# Patient Record
Sex: Female | Born: 1990 | Hispanic: Yes | Marital: Married | Smoking: Never smoker
Health system: Southern US, Community
[De-identification: ages and names within clinical notes are randomized; demographics above are authoritative.]

## PROBLEM LIST (undated history)

## (undated) ENCOUNTER — Inpatient Hospital Stay (HOSPITAL_COMMUNITY): Payer: Self-pay

## (undated) DIAGNOSIS — Z789 Other specified health status: Secondary | ICD-10-CM

## (undated) HISTORY — PX: WISDOM TOOTH EXTRACTION: SHX21

---
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
## 2010-02-19 ENCOUNTER — Emergency Department (HOSPITAL_COMMUNITY)
Admission: EM | Admit: 2010-02-19 | Discharge: 2010-02-19 | Payer: Self-pay | Source: Home / Self Care | Admitting: Emergency Medicine

## 2010-03-10 NOTE — Miscellaneous (Signed)
Summary: ROI  ROI   Imported By: Clydell Hakim 07/07/2009 10:15:43  _____________________________________________________________________  External Attachment:    Type:   Image     Comment:   External Document

## 2010-03-10 NOTE — Assessment & Plan Note (Signed)
Summary: NP visit, bacterial vaginosis   Vital Signs:  Patient profile:   20 year old female Height:      59.75 inches Weight:      125.4 pounds BMI:     24.79 Temp:     98.4 degrees F oral Pulse rate:   86 / minute Pulse rhythm:   regular BP sitting:   108 / 71  (left arm) Cuff size:   regular  Vitals Entered By: Loralee Pacas CMA (Jul 03, 2009 2:23 PM) CC: new patient Is Patient Diabetic? No   Primary Care Provider:  Jamie Brookes MD  CC:  new patient.  History of Present Illness: Pt comes in today to establish care. She is a healthy 20 y/o who comes in with her son. She has been having some vaginal itching and discharge and wants to be tested for STD's today. No other complaints.   Habits & Providers  Alcohol-Tobacco-Diet     Tobacco Status: never  Current Medications (verified): 1)  None  Allergies (verified): No Known Drug Allergies  Past History:  Past Medical History: keeps up on dental care. LAst seen Jan or Feb of 2011 Last pap done at Dr. Robinette Haines office. Feb 2011  Past Surgical History: none  Family History: Dad: unknown Mom: healthy 1 brother: healthy 2 sister: healthy  Social History: lives with mom, (1970), husband Trudi Ida [phone]) and son Terrell Shimko (2009), never smoked, no drugs, no EtOH, exercising 2 days a week or less, high school grad, renting, Smoking Status:  never  Review of Systems        vitals reviewed and pertinent negatives and positives seen in HPI   Physical Exam  General:  Well-developed,well-nourished,in no acute distress; alert,appropriate and cooperative throughout examination Head:  Normocephalic and atraumatic without obvious abnormalities. No apparent alopecia or balding. Eyes:  No corneal or conjunctival inflammation noted. EOMI. Perrla. Vision grossly normal. Ears:  External ear exam shows no significant lesions or deformities.  Otoscopic examination reveals clear canals, tympanic membranes are  intact bilaterally without bulging, retraction, inflammation or discharge. Hearing is grossly normal bilaterally. Nose:  External nasal examination shows no deformity or inflammation. Nasal mucosa are pink and moist without lesions or exudates. Mouth:  Oral mucosa and oropharynx without lesions or exudates.  Teeth in good repair. Neck:  No deformities, masses, or tenderness noted. Lungs:  Normal respiratory effort, chest expands symmetrically. Lungs are clear to auscultation, no crackles or wheezes. Heart:  Normal rate and regular rhythm. S1 and S2 normal without gallop, murmur, click, rub or other extra sounds. Abdomen:  Bowel sounds positive,abdomen soft and non-tender without masses, organomegaly or hernias noted. Genitalia:  Normal introitus for age, no external lesions, no vaginal discharge, mucosa pink and moist, no vaginal or cervical lesions, no vaginal atrophy, no friaility or hemorrhage, normal uterus size and position, no adnexal masses or tenderness Extremities:  No clubbing, cyanosis, edema, or deformity noted with normal full range of motion of all joints.   Skin:  Intact without suspicious lesions or rashes Psych:  Cognition and judgment appear intact. Alert and cooperative with normal attention span and concentration. No apparent delusions, illusions, hallucinations   Impression & Recommendations:  Problem # 1:  PREVENTIVE HEALTH CARE (ICD-V70.0) Assessment New This is a new patient visit. The patient would like to be tested for STD's. Her exam is benign. She notes that she started having some vaginal itching when she got her Implanon which has not yet been 1 year. She  wanted to know if she could take the implanon out when she needed it taken and we are happy to provide that service.   Problem # 2:  SEXUALLY TRANSMITTED DISEASE, EXPOSURE TO (ICD-V01.6) Assessment: Comment Only We will test for HIV and RPR today  Orders: RPR-[facility] ([id number]) HIV-[facility] ([id number])  Problem #  3:  VAGINAL DISCHARGE (ICD-623.5) Assessment: New THis is more a vaginal itching that started after she had the Implanon put in. Likely unrelated but will check labs today.   Orders: GC/Chlamydia[facility] ([id number]) [id number]  Complete Medication List: 1)  Metronidazole 500 Mg Tabs (Metronidazole) .... Take 1 pill by mouth every 12 hours for 7 days. do not breast feed or drink alcohol while taking this medication.  Patient Instructions: 1)  It was good to meet you today.  2)  I will you with any abnormal results.  3)  I hope you have a wonderful day.  Prescriptions: METRONIDAZOLE 500 MG TABS (METRONIDAZOLE) take 1 pill by mouth every 12 hours for 7 days. Do not breast feed or drink alcohol while taking this medication.  #14 x 0   Entered and Authorized by:   Jamie Brookes MD   Signed by:   Jamie Brookes MD on 07/09/2009   Method used:   Electronically to        [id number] (retail)       [address] [place], [place]  [address]       Ph: [phone]       Fax: [phone]   RxID:   [id number]   Laboratory Results  Date/Time Received: Jul 03, 2009 2:54 PM  Date/Time Reported: Jul 03, 2009 3:06 PM   [employer] Source: vag WBC/hpf: 10-20 Bacteria/hpf: 3+  Cocci Clue cells/hpf: many  Positive whiff Yeast/hpf: few Trichomonas/hpf: none Comments: ...............test performed by......Marland KitchenBonnie A. [place], MLS (ASCP)cm     Appended Document: NP visit, bacterial vaginosis pt notified

## 2010-03-11 ENCOUNTER — Encounter: Payer: Self-pay | Admitting: *Deleted

## 2010-05-04 LAB — RAPID STREP SCREEN (MED CTR MEBANE ONLY): Streptococcus, Group A Screen (Direct): NEGATIVE

## 2010-05-04 LAB — MONONUCLEOSIS SCREEN: Mono Screen: NEGATIVE

## 2010-11-10 LAB — CBC
Hemoglobin: 13.3 g/dL (ref 12.0–16.0)
MCHC: 34 g/dL (ref 31.0–37.0)
MCHC: 34.4 g/dL (ref 31.0–37.0)
MCV: 88.6 fL (ref 78.0–98.0)
Platelets: 233 10*3/uL (ref 150–400)
Platelets: 257 10*3/uL (ref 150–400)
RDW: 14.3 % (ref 11.4–15.5)
WBC: 14.3 10*3/uL — ABNORMAL HIGH (ref 4.5–13.5)

## 2010-11-19 ENCOUNTER — Encounter: Payer: Self-pay | Admitting: Family Medicine

## 2010-11-19 ENCOUNTER — Ambulatory Visit (INDEPENDENT_AMBULATORY_CARE_PROVIDER_SITE_OTHER): Payer: Self-pay | Admitting: Family Medicine

## 2010-11-19 VITALS — BP 107/74 | HR 85 | Temp 97.1°F | Ht 61.0 in | Wt 139.0 lb

## 2010-11-19 DIAGNOSIS — Z3046 Encounter for surveillance of implantable subdermal contraceptive: Secondary | ICD-10-CM

## 2010-11-19 NOTE — Patient Instructions (Signed)
What is this medicine? ETONOGESTREL is a contraceptive (birth control) device. It is used to prevent pregnancy. It can be used for up to 3 years. This medicine may be used for other purposes; ask your health care provider or pharmacist if you have questions.   What should I tell my health care provider before I take this medicine? They need to know if you have any of these conditions: -abnormal vaginal bleeding -blood vessel disease or blood clots -cancer of the breast, cervix, or liver -depression -diabetes -gallbladder disease -headaches -heart disease or recent heart attack -high blood pressure -high cholesterol -kidney disease -liver disease -renal disease -seizures -tobacco smoker -an unusual or allergic reaction to etonogestrel, other hormones, anesthetics or antiseptics, medicines, foods, dyes, or preservatives -pregnant or trying to get pregnant -breast-feeding   How should I use this medicine? This device is inserted just under the skin on the inner side of your upper arm by a health care professional. Talk to your pediatrician regarding the use of this medicine in children. Special care may be needed. Overdosage: If you think you've taken too much of this medicine contact a poison control center or emergency room at once. NOTE: This medicine is only for you. Do not share this medicine with others.   What if I miss a dose? This does not apply.   What may interact with this medicine? Do not take this medicine with any of the following medications: -amprenavir -bosentan -fosamprenavir This medicine may also interact with the following medications: -barbiturate medicines for inducing sleep or treating seizures -certain medicines for fungal infections like ketoconazole and itraconazole -griseofulvin -medicines to treat seizures like carbamazepine, felbamate, oxcarbazepine, phenytoin, topiramate -modafinil -phenylbutazone -rifampin -some medicines to treat HIV  infection like atazanavir, indinavir, lopinavir, nelfinavir, tipranavir, ritonavir -St. John's wort This list may not describe all possible interactions. Give your health care provider a list of all the medicines, herbs, non-prescription drugs, or dietary supplements you use. Also tell them if you smoke, drink alcohol, or use illegal drugs. Some items may interact with your medicine.   What should I watch for while using this medicine? This product does not protect you against HIV infection (AIDS) or other sexually transmitted diseases. You should be able to feel the implant by pressing your fingertips over the skin where it was inserted. Tell your doctor if you cannot feel the implant.   What side effects may I notice from receiving this medicine? Side effects that you should report to your doctor or health care professional as soon as possible: -allergic reactions like skin rash, itching or hives, swelling of the face, lips, or tongue -breast lumps -changes in vision -confusion, trouble speaking or understanding -dark urine -depressed mood -general ill feeling or flu-like symptoms -light-colored stools -loss of appetite, nausea -right upper belly pain -severe headaches -severe pain, swelling, or tenderness in the abdomen -shortness of breath, chest pain, swelling in a leg -signs of pregnancy -sudden numbness or weakness of the face, arm or leg -trouble walking, dizziness, loss of balance or coordination -unusual vaginal bleeding, discharge -unusually weak or tired -yellowing of the eyes or skin Side effects that usually do not require medical attention (Report these to your doctor or health care professional if they continue or are bothersome.): -acne -breast pain -changes in weight -cough -fever or chills -headache -irregular menstrual bleeding -itching, burning, and vaginal discharge -pain or difficulty passing urine -sore throat This list may not describe all possible  side effects. Call your doctor for  medical advice about side effects. You may report side effects to FDA at 1-800-FDA-1088.   Where should I keep my medicine? This drug is given in a hospital or clinic and will not be stored at home.   NOTE: This sheet is a summary. It may not cover all possible information. If you have questions about this medicine, talk to your doctor, pharmacist, or health care provider.      2011, Elsevier/Gold Standard.    What is this medicine? ETONOGESTREL is a contraceptive (birth control) device. It is used to prevent pregnancy. It can be used for up to 3 years. This medicine may be used for other purposes; ask your health care provider or pharmacist if you have questions.   What should I tell my health care provider before I take this medicine? They need to know if you have any of these conditions: -abnormal vaginal bleeding -blood vessel disease or blood clots -cancer of the breast, cervix, or liver -depression -diabetes -gallbladder disease -headaches -heart disease or recent heart attack -high blood pressure -high cholesterol -kidney disease -liver disease -renal disease -seizures -tobacco smoker -an unusual or allergic reaction to etonogestrel, other hormones, anesthetics or antiseptics, medicines, foods, dyes, or preservatives -pregnant or trying to get pregnant -breast-feeding   How should I use this medicine? This device is inserted just under the skin on the inner side of your upper arm by a health care professional. Talk to your pediatrician regarding the use of this medicine in children. Special care may be needed. Overdosage: If you think you've taken too much of this medicine contact a poison control center or emergency room at once. NOTE: This medicine is only for you. Do not share this medicine with others.   What if I miss a dose? This does not apply.   What may interact with this medicine? Do not take this medicine with any of  the following medications: -amprenavir -bosentan -fosamprenavir This medicine may also interact with the following medications: -barbiturate medicines for inducing sleep or treating seizures -certain medicines for fungal infections like ketoconazole and itraconazole -griseofulvin -medicines to treat seizures like carbamazepine, felbamate, oxcarbazepine, phenytoin, topiramate -modafinil -phenylbutazone -rifampin -some medicines to treat HIV infection like atazanavir, indinavir, lopinavir, nelfinavir, tipranavir, ritonavir -St. John's wort This list may not describe all possible interactions. Give your health care provider a list of all the medicines, herbs, non-prescription drugs, or dietary supplements you use. Also tell them if you smoke, drink alcohol, or use illegal drugs. Some items may interact with your medicine.   What should I watch for while using this medicine? This product does not protect you against HIV infection (AIDS) or other sexually transmitted diseases. You should be able to feel the implant by pressing your fingertips over the skin where it was inserted. Tell your doctor if you cannot feel the implant.   What side effects may I notice from receiving this medicine? Side effects that you should report to your doctor or health care professional as soon as possible: -allergic reactions like skin rash, itching or hives, swelling of the face, lips, or tongue -breast lumps -changes in vision -confusion, trouble speaking or understanding -dark urine -depressed mood -general ill feeling or flu-like symptoms -light-colored stools -loss of appetite, nausea -right upper belly pain -severe headaches -severe pain, swelling, or tenderness in the abdomen -shortness of breath, chest pain, swelling in a leg -signs of pregnancy -sudden numbness or weakness of the face, arm or leg -trouble walking, dizziness, loss of balance  or coordination -unusual vaginal bleeding,  discharge -unusually weak or tired -yellowing of the eyes or skin Side effects that usually do not require medical attention (Report these to your doctor or health care professional if they continue or are bothersome.): -acne -breast pain -changes in weight -cough -fever or chills -headache -irregular menstrual bleeding -itching, burning, and vaginal discharge -pain or difficulty passing urine -sore throat This list may not describe all possible side effects. Call your doctor for medical advice about side effects. You may report side effects to FDA at 1-800-FDA-1088.   Where should I keep my medicine? This drug is given in a hospital or clinic and will not be stored at home.   NOTE: This sheet is a summary. It may not cover all possible information. If you have questions about this medicine, talk to your doctor, pharmacist, or health care provider.      2011, Elsevier/Gold Standard.

## 2010-11-19 NOTE — Progress Notes (Signed)
Procedure Note: Implanon removal  Informed Consent Obtained for removal of implanon with incision. All Risks, benefits, alternative, complications reviewed with patient and understood. Time Out performed. Using 1% lidocaine local anesethesia was obtained. Betadine was used for antesepsis. Using a 15 blade scalpel a 0.5 cm incision was made at the marking of implanon palpation. Using hemostats the implanon implant was removed. Hemostasis was achieved with pressure. A steri strip and benzoin were used for closure. The patient tolerated the procedure well. No complications occurred. Patient counseled on post-operative care.

## 2010-11-19 NOTE — Progress Notes (Signed)
  Subjective:    Patient ID: Sue Cruz, female    DOB: 10/23/1990, 20 y.o.   MRN: [id number]  HPI 1. Expiration of implanon Patient is travelling to [place] for unknown length of time in three weeks. Her implanon expires in Jan. '13. She plans on using condoms for contraception. No sign of infection on left arm.   Review of Systems No fever, no pregnancies, no hx std.  Using condoms with one husband No bleeding diasthesis    Objective:   Physical Exam Left Arm: normal appearing.     Assessment & Plan:  1. Removal of Implanon Removed in sterile fashion without complication, see procedure note.

## 2010-11-19 NOTE — Assessment & Plan Note (Signed)
Removed today without complication. Patient moving to [place], due out in Jan. '13

## 2011-03-10 ENCOUNTER — Encounter: Payer: Self-pay | Admitting: Family Medicine

## 2011-03-10 ENCOUNTER — Ambulatory Visit (INDEPENDENT_AMBULATORY_CARE_PROVIDER_SITE_OTHER): Payer: Self-pay | Admitting: Family Medicine

## 2011-03-10 VITALS — BP 115/78 | HR 81 | Temp 98.0°F | Ht 63.0 in | Wt 138.0 lb

## 2011-03-10 DIAGNOSIS — N760 Acute vaginitis: Secondary | ICD-10-CM

## 2011-03-10 DIAGNOSIS — N898 Other specified noninflammatory disorders of vagina: Secondary | ICD-10-CM

## 2011-03-10 LAB — POCT WET PREP (WET MOUNT): WBC, Wet Prep HPF POC: >20

## 2011-03-10 MED ORDER — NORGESTIMATE-ETH ESTRADIOL 0.25-35 MG-MCG PO TABS: 1.0000 | ORAL_TABLET | Freq: Every day | ORAL | Status: DC

## 2011-03-10 MED ORDER — METRONIDAZOLE 500 MG PO TABS: 500.0000 mg | ORAL_TABLET | Freq: Two times a day (BID) | ORAL | Status: AC

## 2011-03-10 NOTE — Progress Notes (Signed)
  Subjective:    Patient ID: Sue Cruz, female    DOB: October 18, 1990, 20 y.o.   MRN: [id number]  HPI  Patient presents to same day clinic for vaginal discharge.  She has had discharge intermittently for 3 years.  Patient thought it was secondary to Implanon so she had it removed 2-3 months ago.  Patient still has vaginal discharge.  Described as yellow and thick.  Associated with cramping and pain with intercourse.  Denies any itching, rash, vaginal bleeding, or burning upon urination.  Denies any fever, chills, NS, Nausea or vomiting.    She is sexually active with husband - has had one partner for several years.  She is interested in starting oral contraception today.  Review of Systems  Per HPI    Objective:   Physical Exam  Constitutional: No distress.  Abdominal: Soft. There is no tenderness. There is no rebound and no guarding.  Genitourinary: There is no rash, tenderness, lesion or injury on the right labia. There is no rash, tenderness, lesion or injury on the left labia. Right adnexum displays no mass, no tenderness and no fullness. Left adnexum displays no mass, no tenderness and no fullness. There is tenderness around the vagina. No erythema or bleeding around the vagina. Vaginal discharge found.          Assessment & Plan:

## 2011-03-10 NOTE — Patient Instructions (Signed)
I will call or send you a letter with today's results. Below is information for yeast vs. Bacterial infections. Sprintec has been sent to your pharmacy. Please schedule a follow up appointment with Dr. Rivka Safer to discuss chronic issues.  Bacterial Vaginosis Bacterial vaginosis is an infection of the vagina. A healthy vagina has many kinds of good germs (bacteria). Sometimes the number of good germs can change. This allows bad germs to move in and cause an infection. You may be given medicine (antibiotics) to treat the infection. Or, you may not need treatment at all. HOME CARE  Take your medicine as told. Finish them even if you start to feel better.   Do not have sex until you finish your medicine.   Do not douche.   Practice safe sex.   Tell your sex partner that you have an infection. They should see their doctor for treatment if they have problems.  GET HELP RIGHT AWAY IF:  You do not get better after 3 days of treatment.   You have grey fluid (discharge) coming from your vagina.   You have pain.   You have a temperature of 102 F (38.9 C) or higher.  MAKE SURE YOU:   Understand these instructions.   Will watch your condition.   Will get help right away if you are not doing well or get worse.  Document Released: 11/04/2007 Document Revised: 10/07/2010 Document Reviewed: 11/04/2007 [facility] Patient Information 2012 [place], [place].

## 2011-03-10 NOTE — Assessment & Plan Note (Signed)
Will order GC/Chlamydia and wet prep today.

## 2011-03-11 ENCOUNTER — Telehealth: Payer: Self-pay | Admitting: Family Medicine

## 2011-03-11 LAB — GC/CHLAMYDIA PROBE AMP, GENITAL: GC Probe Amp, Genital: NEGATIVE

## 2011-03-11 NOTE — Telephone Encounter (Signed)
LVM for patient to call back. I called the pharmacy to check on status of these rx's. Patient's flagyl has been ready for pick up, patient has not picked that up yet. The birth control was picked up by her.

## 2011-03-11 NOTE — Telephone Encounter (Signed)
The Rx for Flagyl did not make it to the pharmacy.  She also would like a Birth Control that is less expensive.  She thinks it is less expensive at [facility].  Please call her because she has questions.

## 2011-03-12 NOTE — Telephone Encounter (Signed)
Spoke with patient and I informed her that flagyl was at pharmacy and to call here 1 week before her birth control runs out so we can call in another kind that is cheaper

## 2011-07-15 ENCOUNTER — Ambulatory Visit (INDEPENDENT_AMBULATORY_CARE_PROVIDER_SITE_OTHER): Payer: Self-pay | Admitting: Family Medicine

## 2011-07-15 ENCOUNTER — Encounter: Payer: Self-pay | Admitting: Family Medicine

## 2011-07-15 VITALS — BP 109/66 | HR 73 | Temp 98.9°F | Ht 61.0 in | Wt 136.0 lb

## 2011-07-15 DIAGNOSIS — L509 Urticaria, unspecified: Secondary | ICD-10-CM | POA: Insufficient documentation

## 2011-07-15 MED ORDER — PREDNISONE 20 MG PO TABS: 40.0000 mg | ORAL_TABLET | Freq: Every day | ORAL | Status: AC

## 2011-07-15 NOTE — Assessment & Plan Note (Signed)
Discussed diagnosis with patient and gave hand out.  Due to her discomfort, will Rx prednisone burst x 5 days.  Also advised trying to identify any allergen, and taking antihistamine.  Also advised topical hydrocortisone and moisturizing cream as there does appear to be an eczematous component.

## 2011-07-15 NOTE — Patient Instructions (Signed)
Ronchas (Hives) Las ronchas (urticaria) son manchas rojas hinchadas en la piel, que pican. Pueden cambiar de tamao, forma, ubicacin y Physiological scientist. Las ronchas que se producen en las capas profundas de la piel pueden causar inflamacin en las manos, los pies y [place]. Las ronchas pueden ser una reaccin alrgica a algo que usted o su hijo hayan comido, tocado o hayan colocado sobre la piel. Las ronchas tambin pueden producirse como reaccin al fro, al calor, a las infecciones virales, a las drogas, a las picaduras de [place], o a las situaciones de Librarian, academic. Es frecuente que no se Oceanographer. Las ronchas pueden durar desde [address] a Psychologist, educational. No es un trastorno contagioso. INSTRUCCIONES PARA EL CUIDADO DOMICILIARIO  Si conoce la causa de las ronchas, evite exponerse a esa fuente.   Para aliviar la picazn y la erupcin:   Aplique compresas fras sobre la piel o tome baos de agua fra. No tome ni haga tomar a su hijo baos o duchas calientes porque el calor puede empeorar la picazn.   El mejor medicamento para las ronchas es el antihistamnico. El antihistamnico no curar las ronchas, Biomedical engineer las reducir. Puede utilizar antihistamnicos de [employer]. Este medicamento podr adormecer a su hijo. Los adolescentes no Doctor, hospital al [facility].   Utilice antihistamnico cada 6 horas o hasta que las ronchas hayan desaparecido completamente durante 24 horas, o segn le hayan indicado.   Podrn prescribirle otros medicamentos a su hijo para Associate Professor. Dle los medicamentos a su hijo Chief Operating Officer que lo asiste.   Usted o su hijo deben usar ropa suelta, inclusive la ropa interior. Las irritaciones de la piel pueden empeorar las ronchas.   Realice el seguimiento segn las instrucciones que le ha dado el profesional que lo asiste.  SOLICITE ATENCIN MDICA SI:  Usted o su hijo an sienten una picazn considerable  despus de [employer] (prescriptos o de Sales promotion account executive).   Existe hinchazn o dolor en las articulaciones.  SOLICITE ATENCIN MDICA DE INMEDIATO SI:  Tiene fiebre.   Nota inflamacin en los labios o en la lengua.   Existe dificultad al respirar o tragar, o siente "falta de espacio" en la garganta o en el pecho.   Siente dolor abdominal (en el vientre).   El nio acta como si estuviera enfermo.  Pueden ser los primeros signos de una reaccin alrgica que ponga en peligro la vida. ESTO ES UNA EMERGENCIA. Pida ayuda mdica al 911. EST SEGURO QUE:   Comprende las instrucciones para el alta mdica.   Controlar su enfermedad.   Solicitar atencin mdica de inmediato segn las indicaciones.  Document Released: 01/25/2005 Document Revised: 01/14/2011 [facility] Patient Information 2012 [place], [place].  I want you to take the prednisone two tablets daily for 5 days.  I want you to start taking an over the counter antihistamine- the generic form of zyrtec or Claritin are good choices.  Take that every day.  You can also use over the counter hydrocortisone cream on the rash. You should also use a good hypo allogenic lotion on your dry skin- like Vaseline cream, Cetaphil, Eucerin cream.

## 2011-07-15 NOTE — Progress Notes (Signed)
  Subjective:    Patient ID: Sue Cruz, female    DOB: 02/20/1990, 21 y.o.   MRN: [id number]  HPI  Sue Cruz presents due to an itchy and somewhat tender rash that has been going on for a week.  She says it is extremely irritating and is constantly bothering her.  She says she has large red, slightly raised patches.   It is on her upper arms, her back and her abdomen.  She has one under her right breast that is painful as it is directly under her bra line.    She denies history of allergies, changing soaps/detergents/shampoo's/new pets, etc.  No fevers/chills.   Review of Systems Pertinent items in HPI.     Objective:   Physical Exam BP 109/66  Pulse 73  Temp(Src) 98.9 F (37.2 C) (Oral)  Ht 5\' 1"  (1.549 m)  Wt 136 lb (61.689 kg)  BMI 25.70 kg/m2  LMP 07/08/2011 General appearance: alert, cooperative and uncomfortable.  Skin: Patient has eczematous skin on upper arms/elbows.  She has multiple large hives on arms, back, abdomen.  There is evidence of some excoriation but no suprainfeciton.        Assessment & Plan:

## 2011-07-18 ENCOUNTER — Emergency Department (HOSPITAL_COMMUNITY)
Admission: EM | Admit: 2011-07-18 | Discharge: 2011-07-18 | Disposition: A | Discharge status: Home / Self Care | Payer: Self-pay | Attending: Emergency Medicine | Admitting: Emergency Medicine

## 2011-07-18 ENCOUNTER — Encounter (HOSPITAL_COMMUNITY): Payer: Self-pay | Admitting: Emergency Medicine

## 2011-07-18 DIAGNOSIS — L509 Urticaria, unspecified: Secondary | ICD-10-CM | POA: Insufficient documentation

## 2011-07-18 MED ORDER — DIPHENHYDRAMINE HCL 25 MG PO TABS: 50.0000 mg | ORAL_TABLET | ORAL | Status: DC | PRN

## 2011-07-18 MED ORDER — FAMOTIDINE 20 MG PO TABS: 20.0000 mg | ORAL_TABLET | Freq: Two times a day (BID) | ORAL | Status: DC

## 2011-07-18 NOTE — ED Provider Notes (Signed)
History     CSN: [id number]  Arrival date & time 07/18/11  0910   First MD Initiated Contact with Patient 07/18/11 [id number]      Chief Complaint  Patient presents with  . Rash    (Consider location/radiation/quality/duration/timing/severity/associated sxs/prior treatment) HPI  this 21 year old female has a week or 2 of generalized hives without improvement by taking prednisone over the last 2 days. She also has tried Zyrtec for 2 days without relief. She complains of itching without significant pain. She has no fever and no new exposures that she is aware of. She is no involvement of her mucous membranes. She has no tongue or lip swelling stridor or difficulty swallowing. She has no chest pain cough shortness breath abdominal pain or vomiting. She has no blistering or bruising rash. History reviewed. No pertinent past medical history.  History reviewed. No pertinent past surgical history.  History reviewed. No pertinent family history.  History  Substance Use Topics  . Smoking status: Never Smoker   . Smokeless tobacco: Not on file  . Alcohol Use: No    OB History    Grav Para Term Preterm Abortions TAB SAB Ect Mult Living                  Review of Systems  Constitutional: Negative for fever.       10 Systems reviewed and are negative for acute change except as noted in the HPI.  HENT: Negative for congestion.   Eyes: Negative for discharge and redness.  Respiratory: Negative for cough and shortness of breath.   Cardiovascular: Negative for chest pain.  Gastrointestinal: Negative for vomiting and abdominal pain.  Musculoskeletal: Negative for back pain.  Skin: Positive for rash.  Neurological: Negative for syncope, numbness and headaches.  Psychiatric/Behavioral:       No behavior change.    Allergies  Review of patient's allergies indicates no known allergies.  Home Medications   Current Outpatient Rx  Name Route Sig Dispense Refill  . NEXPLANON  Subcutaneous  Inject into the skin. Implanted February 2013    . HYDROCORTISONE 1 % EX CREA Topical Apply 1 application topically 3 (three) times daily as needed. For rash/itching    . PREDNISONE 20 MG PO TABS Oral Take 2 tablets (40 mg total) by mouth daily. For 5 days 10 tablet 0  . DIPHENHYDRAMINE HCL 25 MG PO TABS Oral Take 2 tablets (50 mg total) by mouth every 4 (four) hours as needed for itching. 20 tablet 0  . FAMOTIDINE 20 MG PO TABS Oral Take 1 tablet (20 mg total) by mouth 2 (two) times daily. 10 tablet 0    BP 108/68  Pulse 60  Temp(Src) 97.9 F (36.6 C) (Oral)  Resp 16  SpO2 99%  LMP 07/08/2011  Physical Exam  Nursing note and vitals reviewed. Constitutional:       Awake, alert, nontoxic appearance.  HENT:  Head: Atraumatic.  Mouth/Throat: Oropharynx is clear and moist.       No swelling or involvement of her face  Eyes: Right eye exhibits no discharge. Left eye exhibits no discharge.  Neck: Neck supple.  Cardiovascular: Normal rate and regular rhythm.   No murmur heard. Pulmonary/Chest: Effort normal and breath sounds normal. No respiratory distress. She has no wheezes. She has no rales. She exhibits no tenderness.  Abdominal: Soft. Bowel sounds are normal. There is no tenderness. There is no rebound and no guarding.  Musculoskeletal: She exhibits no edema and no tenderness.  Baseline ROM, no obvious new focal weakness.  Neurological: She is alert.       Mental status and motor strength appears baseline for patient and situation.  Skin: Rash noted.       Scattered urticarial plaques on both arms trunk and left leg without petechiae, purpura, vesicles, secondary cellulitis, or tenderness.  Psychiatric: She has a normal mood and affect.    ED Course  Procedures (including critical care time)  Labs Reviewed - No data to display No results found.   1. Hives       MDM  Pt stable in ED with no significant deterioration in condition.Patient / Family / Caregiver  informed of clinical course, understand medical decision-making process, and agree with plan.I doubt any other EMC precluding discharge at this time including, but not necessarily limited to the following:anaphylaxis, SBI.         Hurman Horn, MD 07/18/11 2145

## 2011-07-18 NOTE — Discharge Instructions (Signed)
Your caregiver has diagnosed you as having hives.  If you know what caused the hives, avoid that trigger. Your primary caregiver may recommend that you see an allergy specialist to determine your cause(s). SEEK MEDICAL ATTENTION IF: You still have considerable itching after taking the medication (prescribed or purchased over the counter) for 24 hours.  A temperature above 100.4 develops.  You have any pain or swelling in your joints.  Your hives last more than 1 week.  You develop new and unexplained symptoms (problems). SEEK IMMEDIATE MEDICAL ATTENTION IF: You have swollen lips or tongue, shortness of breath, dizziness, blistering rash, painful or bruising rash, or other concerns.

## 2011-07-18 NOTE — ED Notes (Addendum)
Pt reports generalized rash x 2 weeks. Pt seen by PMD on Thursday and was given prescription for prednisone. Rash is worse since taken prednisone. Pt denies recent use of new products.

## 2011-07-30 ENCOUNTER — Ambulatory Visit: Payer: Self-pay | Admitting: Family Medicine

## 2013-09-03 ENCOUNTER — Emergency Department (HOSPITAL_COMMUNITY)
Admission: EM | Admit: 2013-09-03 | Discharge: 2013-09-03 | Disposition: A | Discharge status: Home / Self Care | Payer: BC Managed Care – PPO | Attending: Emergency Medicine | Admitting: Emergency Medicine

## 2013-09-03 ENCOUNTER — Emergency Department (HOSPITAL_COMMUNITY): Payer: BC Managed Care – PPO

## 2013-09-03 ENCOUNTER — Encounter (HOSPITAL_COMMUNITY): Payer: Self-pay | Admitting: Emergency Medicine

## 2013-09-03 DIAGNOSIS — R1032 Left lower quadrant pain: Secondary | ICD-10-CM

## 2013-09-03 DIAGNOSIS — R1031 Right lower quadrant pain: Secondary | ICD-10-CM

## 2013-09-03 DIAGNOSIS — Z3202 Encounter for pregnancy test, result negative: Secondary | ICD-10-CM | POA: Diagnosis not present

## 2013-09-03 DIAGNOSIS — K5900 Constipation, unspecified: Secondary | ICD-10-CM | POA: Diagnosis not present

## 2013-09-03 DIAGNOSIS — R1033 Periumbilical pain: Secondary | ICD-10-CM | POA: Diagnosis not present

## 2013-09-03 DIAGNOSIS — N390 Urinary tract infection, site not specified: Secondary | ICD-10-CM

## 2013-09-03 DIAGNOSIS — R112 Nausea with vomiting, unspecified: Secondary | ICD-10-CM | POA: Insufficient documentation

## 2013-09-03 DIAGNOSIS — R1084 Generalized abdominal pain: Secondary | ICD-10-CM | POA: Insufficient documentation

## 2013-09-03 LAB — CBC WITH DIFFERENTIAL/PLATELET
BASOS ABS: 0 10*3/uL (ref 0.0–0.1)
BASOS PCT: 0 % (ref 0–1)
EOS ABS: 0.2 10*3/uL (ref 0.0–0.7)
EOS PCT: 2 % (ref 0–5)
HCT: 36.5 % (ref 36.0–46.0)
Hemoglobin: 12.3 g/dL (ref 12.0–15.0)
Lymphocytes Relative: 13 % (ref 12–46)
Lymphs Abs: 1.6 10*3/uL (ref 0.7–4.0)
MCH: 27.4 pg (ref 26.0–34.0)
MCHC: 33.7 g/dL (ref 30.0–36.0)
MCV: 81.3 fL (ref 78.0–100.0)
Monocytes Absolute: 0.6 10*3/uL (ref 0.1–1.0)
Monocytes Relative: 5 % (ref 3–12)
NEUTROS PCT: 80 % — AB (ref 43–77)
Neutro Abs: 9.7 10*3/uL — ABNORMAL HIGH (ref 1.7–7.7)
PLATELETS: 275 10*3/uL (ref 150–400)
RBC: 4.49 MIL/uL (ref 3.87–5.11)
RDW: 12.4 % (ref 11.5–15.5)
WBC: 12.1 10*3/uL — ABNORMAL HIGH (ref 4.0–10.5)

## 2013-09-03 LAB — URINALYSIS, ROUTINE W REFLEX MICROSCOPIC
Bilirubin Urine: NEGATIVE
GLUCOSE, UA: NEGATIVE mg/dL
HGB URINE DIPSTICK: NEGATIVE
Ketones, ur: NEGATIVE mg/dL
Nitrite: NEGATIVE
PROTEIN: 30 mg/dL — AB
SPECIFIC GRAVITY, URINE: 1.027 (ref 1.005–1.030)
UROBILINOGEN UA: 1 mg/dL (ref 0.0–1.0)
pH: 7 (ref 5.0–8.0)

## 2013-09-03 LAB — COMPREHENSIVE METABOLIC PANEL
ALBUMIN: 3.9 g/dL (ref 3.5–5.2)
ALK PHOS: 61 U/L (ref 39–117)
ALT: 13 U/L (ref 0–35)
AST: 16 U/L (ref 0–37)
Anion gap: 15 (ref 5–15)
BUN: 12 mg/dL (ref 6–23)
CALCIUM: 9 mg/dL (ref 8.4–10.5)
CO2: 25 mEq/L (ref 19–32)
Chloride: 103 mEq/L (ref 96–112)
Creatinine, Ser: 0.6 mg/dL (ref 0.50–1.10)
GFR calc Af Amer: >90 mL/min (ref >90)
GFR calc non Af Amer: >90 mL/min (ref >90)
Glucose, Bld: 113 mg/dL — ABNORMAL HIGH (ref 70–99)
POTASSIUM: 4 meq/L (ref 3.7–5.3)
SODIUM: 143 meq/L (ref 137–147)
TOTAL PROTEIN: 7 g/dL (ref 6.0–8.3)
Total Bilirubin: 0.3 mg/dL (ref 0.3–1.2)

## 2013-09-03 LAB — URINE MICROSCOPIC-ADD ON

## 2013-09-03 LAB — PREGNANCY, URINE: PREG TEST UR: NEGATIVE

## 2013-09-03 LAB — POC URINE PREG, ED: Preg Test, Ur: NEGATIVE

## 2013-09-03 MED ORDER — IBUPROFEN 600 MG PO TABS: 600.0000 mg | ORAL_TABLET | Freq: Four times a day (QID) | ORAL | Status: DC | PRN

## 2013-09-03 MED ORDER — SODIUM CHLORIDE 0.9 % IV SOLN
Freq: Once | INTRAVENOUS | Status: AC
  Administered 2013-09-03: 150 mL/h via INTRAVENOUS

## 2013-09-03 MED ORDER — HYDROCODONE-ACETAMINOPHEN 5-325 MG PO TABS
1.0000 | ORAL_TABLET | Freq: Four times a day (QID) | ORAL | Status: DC | PRN
Start: 2013-09-03 — End: 2014-04-25

## 2013-09-03 MED ORDER — NITROFURANTOIN MONOHYD MACRO 100 MG PO CAPS: 100.0000 mg | ORAL_CAPSULE | Freq: Two times a day (BID) | ORAL | Status: DC

## 2013-09-03 MED ORDER — IOHEXOL 300 MG/ML  SOLN
80.0000 mL | Freq: Once | INTRAMUSCULAR | Status: AC | PRN
  Administered 2013-09-03: 80 mL via INTRAVENOUS

## 2013-09-03 NOTE — ED Notes (Signed)
Presents onset of generalized abdominal pain,  Nausea, vomiting and inability to have BM began 2 hours ago. Last normal BM 12 hours ago.  Describes the pain as "too much" reports abdominal bloating, pain constant and associated with dizziness.

## 2013-09-03 NOTE — ED Provider Notes (Signed)
CSN: [id number]     Arrival date & time 09/03/13  0059 History   First MD Initiated Contact with Patient 09/03/13 0131     Chief Complaint  Patient presents with  . Abdominal Pain     (Consider location/radiation/quality/duration/timing/severity/associated sxs/prior Treatment) HPI Comments: PT comes in with cc of abd pain. Pt had sudden onset abd pain that woke her up from sleep. Abd pain is generalized, periumbilical, with nausea, emesis. No BM, and patient feels like she is not passing gas.  Patient is a 23 y.o. female presenting with abdominal pain. The history is provided by the patient.  Abdominal Pain Associated symptoms: constipation, nausea and vomiting   Associated symptoms: no chest pain, no dysuria and no shortness of breath     History reviewed. No pertinent past medical history. History reviewed. No pertinent past surgical history. History reviewed. No pertinent family history. History  Substance Use Topics  . Smoking status: Never Smoker   . Smokeless tobacco: Not on file  . Alcohol Use: No   OB History   Grav Para Term Preterm Abortions TAB SAB Ect Mult Living                 Review of Systems  Constitutional: Positive for activity change.  Respiratory: Negative for shortness of breath.   Cardiovascular: Negative for chest pain.  Gastrointestinal: Positive for nausea, vomiting, abdominal pain and constipation.  Genitourinary: Negative for dysuria.  Musculoskeletal: Negative for neck pain.  Neurological: Negative for headaches.      Allergies  Review of patient's allergies indicates no known allergies.  Home Medications   Prior to Admission medications   Medication Sig Start Date End Date Taking? Authorizing Provider  Etonogestrel (NEXPLANON [place]) Inject 1 application into the skin once. Implanted February 2013   Yes Historical Provider, MD  hydrocortisone cream 1 % Apply 1 application topically 3 (three) times daily as needed. For rash/itching   Yes  Historical Provider, MD  HYDROcodone-acetaminophen (NORCO/VICODIN) 5-325 MG per tablet Take 1 tablet by mouth every 6 (six) hours as needed. 09/03/13   Derwood Kaplan, MD  ibuprofen (ADVIL,MOTRIN) 600 MG tablet Take 1 tablet (600 mg total) by mouth every 6 (six) hours as needed. 09/03/13   Eward Rutigliano Rhunette Croft, MD   BP 101/62  Pulse 82  Temp(Src) 97.9 F (36.6 C) (Oral)  Resp 19  SpO2 99%  LMP 08/14/2013 Physical Exam  Nursing note and vitals reviewed. Constitutional: She is oriented to person, place, and time. She appears well-developed and well-nourished.  HENT:  Head: Normocephalic and atraumatic.  Eyes: EOM are normal. Pupils are equal, round, and reactive to light.  Neck: Neck supple.  Cardiovascular: Normal rate, regular rhythm and normal heart sounds.   No murmur heard. Pulmonary/Chest: Effort normal. No respiratory distress.  Abdominal: Soft. She exhibits no distension. There is tenderness. There is no rebound and no guarding.  Neurological: She is alert and oriented to person, place, and time.  Skin: Skin is warm and dry.    ED Course  Procedures (including critical care time) Labs Review Labs Reviewed  CBC WITH DIFFERENTIAL - Abnormal; Notable for the following:    WBC 12.1 (*)    Neutrophils Relative % 80 (*)    Neutro Abs 9.7 (*)    All other components within normal limits  COMPREHENSIVE METABOLIC PANEL - Abnormal; Notable for the following:    Glucose, Bld 113 (*)    All other components within normal limits  URINALYSIS, ROUTINE W REFLEX MICROSCOPIC -  Abnormal; Notable for the following:    Protein, ur 30 (*)    Leukocytes, UA TRACE (*)    All other components within normal limits  URINE MICROSCOPIC-ADD ON - Abnormal; Notable for the following:    Squamous Epithelial / LPF MANY (*)    Bacteria, UA MANY (*)    All other components within normal limits  PREGNANCY, URINE  POC URINE PREG, ED    Imaging Review US Transvaginal Non-ob  09/03/2013   CLINICAL DATA:   Pelvic pain  EXAM: TRANSABDOMINAL AND TRANSVAGINAL ULTRASOUND OF PELVIS  DOPPLER ULTRASOUND OF OVARIES  TECHNIQUE: Both transabdominal and transvaginal ultrasound examinations of the pelvis were performed. Transabdominal technique was performed for global imaging of the pelvis including uterus, ovaries, adnexal regions, and pelvic cul-de-sac.  It was necessary to proceed with endovaginal exam following the transabdominal exam to visualize the uterus and ovaries. Color and duplex Doppler ultrasound was utilized to evaluate blood flow to the ovaries.  COMPARISON:  None.  FINDINGS: Uterus  Measurements: 7.7 x 4.5 x 4.8 cm, slight anteversion. No fibroids or other mass visualized.  Endometrium  Thickness: 3.6 mm.  No focal abnormality visualized.  Right ovary  Measurements: 3.4 x 2.2 x 2.7 cm. Normal follicular cystic changes. No abnormal adnexal masses.  Left ovary  Measurements: 5.3 x 3 x 3.8 cm. Cyst in the left ovary with small nodular component measuring 4.6 x 2.8 x 3.2 cm. No flow is demonstrated within nodular component. Followup ultrasound in 6-12 weeks is recommended to demonstrate resolution.  Pulsed Doppler evaluation of both ovaries demonstrates normal low-resistance arterial and venous waveforms. Flow is demonstrated within both ovaries on color flow Doppler imaging.  Other findings  No free fluid.  IMPRESSION: Left ovarian cyst measuring 4.6 cm maximal diameter and containing small mural nodularity. 6-12 week follow-up recommended. Uterus and right ovary are unremarkable.   Electronically Signed   By: Burman Nieves M.D.   On: 09/03/2013 04:51   US Pelvis Complete  09/03/2013   CLINICAL DATA:  Pelvic pain  EXAM: TRANSABDOMINAL AND TRANSVAGINAL ULTRASOUND OF PELVIS  DOPPLER ULTRASOUND OF OVARIES  TECHNIQUE: Both transabdominal and transvaginal ultrasound examinations of the pelvis were performed. Transabdominal technique was performed for global imaging of the pelvis including uterus, ovaries, adnexal  regions, and pelvic cul-de-sac.  It was necessary to proceed with endovaginal exam following the transabdominal exam to visualize the uterus and ovaries. Color and duplex Doppler ultrasound was utilized to evaluate blood flow to the ovaries.  COMPARISON:  None.  FINDINGS: Uterus  Measurements: 7.7 x 4.5 x 4.8 cm, slight anteversion. No fibroids or other mass visualized.  Endometrium  Thickness: 3.6 mm.  No focal abnormality visualized.  Right ovary  Measurements: 3.4 x 2.2 x 2.7 cm. Normal follicular cystic changes. No abnormal adnexal masses.  Left ovary  Measurements: 5.3 x 3 x 3.8 cm. Cyst in the left ovary with small nodular component measuring 4.6 x 2.8 x 3.2 cm. No flow is demonstrated within nodular component. Followup ultrasound in 6-12 weeks is recommended to demonstrate resolution.  Pulsed Doppler evaluation of both ovaries demonstrates normal low-resistance arterial and venous waveforms. Flow is demonstrated within both ovaries on color flow Doppler imaging.  Other findings  No free fluid.  IMPRESSION: Left ovarian cyst measuring 4.6 cm maximal diameter and containing small mural nodularity. 6-12 week follow-up recommended. Uterus and right ovary are unremarkable.   Electronically Signed   By: Burman Nieves M.D.   On: 09/03/2013 04:51  Ct Abdomen Pelvis W Contrast  09/03/2013   CLINICAL DATA:  Generalized abdominal pain right of the umbilicus. Nausea, vomiting, constipation. Right lower quadrant pain.  EXAM: CT ABDOMEN AND PELVIS WITH CONTRAST  TECHNIQUE: Multidetector CT imaging of the abdomen and pelvis was performed using the standard protocol following bolus administration of intravenous contrast.  CONTRAST:  80mL OMNIPAQUE IOHEXOL 300 MG/ML  SOLN  COMPARISON:  Ultrasound pelvis 09/03/2013  FINDINGS: Atelectasis in the lung bases.  Mild diffuse fatty infiltration of the liver with more prominent focal fatty infiltration along the falciform ligament. No focal lesions identified otherwise.  Gallbladder, spleen, pancreas, adrenal glands, kidneys, abdominal aorta, inferior vena cava, and retroperitoneal lymph nodes are unremarkable. Small accessory spleen. Stomach and small bowel are decompressed. Colon is not abnormally distended. No free air or free fluid in the abdomen.  Pelvis: Large cyst in the left adnexal region measuring 2.8 x 5.9 cm. See additional report of pelvic ultrasound for characterization. Uterus and right ovary are not enlarged. No significant pelvic lymphadenopathy. No free pelvic fluid collections. No evidence of diverticulitis. Appendix is normal. No destructive bone lesions.  IMPRESSION: Normal appendix. Left adnexal cyst as previously demonstrated on ultrasound. No acute inflammatory process demonstrated in the abdomen or pelvis. Diffuse fatty infiltration of the liver.   Electronically Signed   By: Burman NievesWilliam  Stevens M.D.   On: 09/03/2013 06:54   [place] Art/ven Flow Abd Pelv Doppler  09/03/2013   CLINICAL DATA:  Pelvic pain  EXAM: TRANSABDOMINAL AND TRANSVAGINAL ULTRASOUND OF PELVIS  DOPPLER ULTRASOUND OF OVARIES  TECHNIQUE: Both transabdominal and transvaginal ultrasound examinations of the pelvis were performed. Transabdominal technique was performed for global imaging of the pelvis including uterus, ovaries, adnexal regions, and pelvic cul-de-sac.  It was necessary to proceed with endovaginal exam following the transabdominal exam to visualize the uterus and ovaries. Color and duplex Doppler ultrasound was utilized to evaluate blood flow to the ovaries.  COMPARISON:  None.  FINDINGS: Uterus  Measurements: 7.7 x 4.5 x 4.8 cm, slight anteversion. No fibroids or other mass visualized.  Endometrium  Thickness: 3.6 mm.  No focal abnormality visualized.  Right ovary  Measurements: 3.4 x 2.2 x 2.7 cm. Normal follicular cystic changes. No abnormal adnexal masses.  Left ovary  Measurements: 5.3 x 3 x 3.8 cm. Cyst in the left ovary with small nodular component measuring 4.6 x 2.8 x 3.2 cm.  No flow is demonstrated within nodular component. Followup ultrasound in 6-12 weeks is recommended to demonstrate resolution.  Pulsed Doppler evaluation of both ovaries demonstrates normal low-resistance arterial and venous waveforms. Flow is demonstrated within both ovaries on color flow Doppler imaging.  Other findings  No free fluid.  IMPRESSION: Left ovarian cyst measuring 4.6 cm maximal diameter and containing small mural nodularity. 6-12 week follow-up recommended. Uterus and right ovary are unremarkable.   Electronically Signed   By: Burman NievesWilliam  Stevens M.D.   On: 09/03/2013 04:51     EKG Interpretation None      MDM   Final diagnoses:  Abdominal pain, bilateral lower quadrant    PT with non specific abd pain. The exam is non peritoneal. Given the characteristics of the pain - US and CT ordered to r/o acute pathology, and the results are normal.   Derwood KaplanAnkit Nakina Spatz, MD 09/12/13 [id number]

## 2013-09-03 NOTE — Discharge Instructions (Signed)
We saw you in the ER for the abdominal pain. All of our results are normal, including all labs and imaging. Kidney function is fine as well. We are not sure what is causing your abdominal pain, and recommend that you see your primary care doctor within 2-3 days for further evaluation. If your symptoms get worse, return to the ER. Take the pain meds and nausea meds as prescribed.  Dolor abdominal en las mujeres (Abdominal Pain, Women) El dolor abdominal (en el estmago, la pelvis o el vientre) puede tener muchas causas. Es importante que le informe a su mdico:  La ubicacin del Engineer, mining.  Viene y se va, o persiste todo el tiempo?  Hay situaciones que Location manager (comer ciertos alimentos, la actividad fsica)?  Tiene otros sntomas asociados al dolor (fiebre, nuseas, vmitos, diarrea)? Todo es de gran ayuda cuando se trata de hallar la causa del dolor. CAUSAS  Estmago: Infecciones por virus o bacterias, o lcera.  Intestino: Apendicitis (apndice inflamado), ileitis regional (enfermedad de Crohn), colitis ulcerosa (colon inflamado), sndrome del colon irritable, diverticulitis (inflamacin de los divertculos del colon) o cncer de estmago oo intestino.  Enfermedades de la vescula biliar o clculos.  Enfermedades renales, clculos o infecciones en el rin.  Infeccin o cncer del pncreas.  Fibromialgia (trastorno doloroso)  Enfermedades de los rganos femeninos:  Uterus: tero: fibroma (tumor no canceroso) o infeccin  Trompas de Falopio: infeccin o embarazo ectpico  En los ovarios, quistes o tumores.  Adherencias plvicas (tejido cicatrizal).  Endometriosis (el tejido que cubre el tero se desarrolla en la pelvis y los rganos plvicos).  Sndrome de Agricultural engineer (los rganos femeninos se llenan de sangre antes del periodo menstrual(  Dolor durante el periodo menstrual.  Dolor durante la ovulacin (al producir vulos).  Dolor al usar el DIU  (dispositivo intrauterino para el control de la natalidad)  Psychologist, clinical los rganos femeninos.  Dolor funcional (no est originado en una enfermedad, puede mejorar sin tratamiento).  Dolor de origen psicolgico  Depresin. DIAGNSTICO Su mdico decidir la gravedad del dolor a travs del examen fsico  Anlisis de sangre  Radiografas  Ecografas  TC (tomografa computada, tipo especial de radiografas).  IMR (resonancia magntica)  Cultivos, en el caso una infeccin  Colon por enema de bario (se inserta una sustancia de contraste en el intestino grueso para mejorar la observacin con rayos X.)  Colonoscopa (observacin del intestino con un tubo luminoso).  Laparoscopa (examen del interior del abdomen con un tubo que tiene [employer]).  Ciruga exploratoria abdominal mayor (se observa el abdomen realizando una gran incisin). TRATAMIENTO El tratamiento depender de la causa del problema.   Muchos de estos casos pueden controlarse y tratarse en casa.  Medicamentos de venta libre indicados por el mdico.  Medicamentos con receta.  Antibiticos, en caso de infeccin  Pldoras anticonceptivas, en el caso de perodos dolorosos o dolor al ovular.  Tratamiento hormonal, para la endometriosis  Inyecciones para bloqueo nervioso selectivo.  Fisioterapia.  Antidepresivos.  Consejos por parte de un psclogo o psiquiatra.  Ciruga mayor o menor. INSTRUCCIONES PARA EL CUIDADO DOMICILIARIO  No tome ni administre laxantes a menos que se lo haya indicado su mdico.  Tome analgsicos de venta libre slo si se lo ha indicado el profesional que lo asiste. No tome aspirina, ya que puede causar [employer] o hemorragias.  Consuma una dieta lquida (caldo o agua) segn lo indicado por el mdico. Progrese lentamente a una dieta blanda, segn la  tolerancia, si el dolor se relaciona con el estmago o el intestino.  Tenga un termmetro y tmese la temperatura varias  veces al da.  Haga reposo en la cama y Rehobethduerma, si esto Research scientist (life sciences)alivia el dolor.  Evite las relaciones sexuales, Counsellorsi le producen dolor.  Evite las situaciones estresantes.  Cumpla con las visitas y los anlisis de control, segn las indicaciones de su mdico.  Si el dolor no se [place] con los medicamentos o la [place], [place]uede tratar con:  Acupuntura.  Ejercicios de relajacin (yoga, meditacin).  Terapia grupal.  Psicoterapia. SOLICITE ATENCIN MDICA SI:  Nota que ciertos Pharmacist, communityalimentos le producen dolor de Duryeaestmago.  El tratamiento indicado para Arboriculturistrealizar en el hogar no Marketing executivele alivia el dolor.  Necesita analgsicos ms fuertes.  Quiere que le retiren el DIU.  Si se siente confundido o desfalleciente.  Presenta nuseas o vmitos.  Aparece una erupcin cutnea.  Sufre efectos adversos o una reaccin alrgica debido a los medicamentos que toma. SOLICITE ATENCIN MDICA DE INMEDIATO SI:  El dolor persiste o se agrava.  Tiene fiebre.  Siente el dolor slo en algunos sectores del abdomen. Si se localiza en la zona derecha, posiblemente podra tratarse de apendicitis. En un adulto, si se localiza en la regin inferior izquierda del abdomen, podra tratarse de colitis o diverticulitis.  Hay sangre en las heces (deposiciones de color rojo brillante o negro alquitranado), con o sin vmitos.  Usted presenta sangre en la orina.  Siente escalofros con o sin fiebre.  Se desmaya. ASEGRESE QUE:   Comprende estas instrucciones.  Controlar su enfermedad.  Solicitar ayuda de inmediato si no mejora o si empeora. Document Released: 05/13/2008 Document Revised: 04/19/2011 [facility] AssociationExitCare Patient Information 2015 [employer], [place]. This information is not intended to replace advice given to you by your health care provider. Make sure you discuss any questions you have with your health care provider.

## 2013-09-03 NOTE — ED Notes (Signed)
Pt taken to CT.

## 2013-09-05 ENCOUNTER — Ambulatory Visit (INDEPENDENT_AMBULATORY_CARE_PROVIDER_SITE_OTHER): Payer: BC Managed Care – PPO | Admitting: Gynecology

## 2013-09-05 ENCOUNTER — Encounter: Payer: Self-pay | Admitting: Gynecology

## 2013-09-05 VITALS — BP 112/68 | Ht 61.0 in | Wt 146.0 lb

## 2013-09-05 DIAGNOSIS — N83209 Unspecified ovarian cyst, unspecified side: Secondary | ICD-10-CM

## 2013-09-05 DIAGNOSIS — Z975 Presence of (intrauterine) contraceptive device: Secondary | ICD-10-CM | POA: Insufficient documentation

## 2013-09-05 DIAGNOSIS — R102 Pelvic and perineal pain: Secondary | ICD-10-CM

## 2013-09-05 DIAGNOSIS — N83202 Unspecified ovarian cyst, left side: Secondary | ICD-10-CM | POA: Insufficient documentation

## 2013-09-05 DIAGNOSIS — Z23 Encounter for immunization: Secondary | ICD-10-CM

## 2013-09-05 DIAGNOSIS — N949 Unspecified condition associated with female genital organs and menstrual cycle: Secondary | ICD-10-CM

## 2013-09-05 NOTE — Progress Notes (Signed)
   Patient is a 23 year old gravida 1 para 1 who presented to the office for followup after having been seen in the emergency room 2 days ago as a result of sudden onset of lower abdominal pains. It was described as generalized, periumbilical with nausea and vomiting. Patient was in no acute distress today. She is currently menstruating. She had a Nexplanon placed 2-1/2 years ago.  Patient had an abdominal and pelvic CT scan with the following impression: Normal appendix. Left adnexal cyst as previously demonstrated on  ultrasound. No acute inflammatory process demonstrated in the  abdomen or pelvis. Diffuse fatty infiltration of the liver.  Pelvis: Large cyst in the left adnexal region measuring 2.8 x 5.9  cm. See additional report of pelvic ultrasound for characterization.  Uterus and right ovary are not enlarged. No significant pelvic  lymphadenopathy. No free pelvic fluid collections. No evidence of  diverticulitis. Appendix is normal. No destructive bone lesions.  Ultrasound report: Left ovary  Measurements: 5.3 x 3 x 3.8 cm. Cyst in the left ovary with small  nodular component measuring 4.6 x 2.8 x 3.2 cm. No flow is  demonstrated within nodular component. Followup ultrasound in 6-12  weeks is recommended to demonstrate resolution.Pulsed Doppler evaluation of both ovaries demonstrates normal  low-resistance arterial and venous waveforms. Flow is demonstrated  within both ovaries on color flow Doppler imaging. No free fluid in the pelvis described.  The patient was found to have a normal hemoglobin hematocrit white blood count 12.1 and normal conference metabolic panel and negative urine pregnancy test. She is here for followup. She states her last gynecological exam and Pap smear was [facility] years [facility]. She denies any prior history of abnormal Pap smear. She has not received the HPV vaccine yet.  Exam: Abdomen: Soft nontender no rebound or guarding Pelvic: Bartholin  urethra Skene glands within normal limits Vagina: Menstrual blood present Cervix: Menstrual blood present Bimanual exam: Uterus anteverted normal size shape and consistency nontender Adnexa no palpable mass or tenderness Rectal exam not done  Assessment/plan: Patient with a left ovarian cyst measuring 4.6 x 2.8 x 3.2 cm with a reported nodular component within the cyst. Patient denies any family history of ovarian cancer. We're going to check a CA 125 today (limitations of the study discussed with the patient). If normal she will return back to the office in 2 months for a followup ultrasound. Literature information on ovarian cysts as well as on the HPV vaccine was provided. She received the first dose of the HPV vaccine and she'll receive a second dose when she returns to the office in 2 months for ultrasound.

## 2013-09-05 NOTE — Patient Instructions (Addendum)
Quiste ovrico (Ovarian Cyst) Un quiste ovrico es una bolsa llena de lquido que se forma en el ovario. Los ovarios son los rganos pequeos que producen vulos en las mujeres. Se pueden formar varios tipos de quistes en los ovarios. La mayora no son cancerosos. Muchos de ellos no causan problemas y con frecuencia desaparecen solos. Algunos pueden provocar sntomas y requerir tratamiento. Los tipos ms comunes de quistes ovricos son los siguientes:  Quistes funcionales: estos quistes pueden aparecer todos los meses durante el ciclo menstrual. Esto es normal. Estos quistes suelen desaparecer con el prximo ciclo menstrual si la mujer no queda embarazada. En general, los quistes funcionales no tienen sntomas.  Endometriomas: estos quistes se forman a partir del tejido que recubre el tero. Tambin se denominan "quistes de chocolate" porque se llenan de sangre que se vuelve marrn. Este tipo de quiste puede provocar dolor en la zona inferior del abdomen durante la relacin sexual y con el perodo menstrual.  Cistoadenomas: este tipo se desarrolla a partir de las clulas que se ubican en el exterior del ovario. Estos quistes pueden ser muy grandes y causar dolor en la zona inferior del abdomen y durante la relacin sexual. Este tipo de quiste puede girar sobre s mismo, cortar el suministro de sangre y causar un dolor intenso. Tambin se puede romper con facilidad y provocar mucho dolor.  Quistes dermoides: este tipo de quiste a veces se encuentra en ambos ovarios. Estos quistes pueden contener diferentes tipos de tejidos del organismo, como piel, dientes, pelo o cartlago. Generalmente no tienen sntomas, a menos que sean muy grandes.  Quistes tecalutenicos: aparecen cuando se produce demasiada cantidad de cierta hormona (gonadotropina corinica humana) que estimula en exceso al ovario para que produzca vulos. Esto es ms frecuente despus de procedimientos que ayudan a la concepcin de un beb  (fertilizacin in vitro). CAUSAS   Los medicamentos para la fertilidad pueden provocar una afeccin mediante la cual se forman mltiples quistes de gran tamao en los ovarios. Esta se denomina sndrome de hiperestimulacin ovrica.  El sndrome del ovario poliqustico es una afeccin que puede causar desequilibrios hormonales, los cuales pueden dar como resultado quistes ovricos no funcionales. SIGNOS Y SNTOMAS  Muchos quistes ovricos no causan sntomas. Si se presentan sntomas, stos pueden ser:  Dolor o molestias en la pelvis.  Dolor en la parte baja del abdomen.  Dolor durante las relaciones sexuales.  Aumento del permetro abdominal (hinchazn).  Perodos menstruales anormales.  Aumento del dolor en los perodos menstruales.  Cese de los perodos menstruales sin estar embarazada. DIAGNSTICO  Estos quistes se descubren comnmente durante un examen de rutina o una exploracin ginecolgica anual. Es posible que se ordenen otros estudios para obtener ms informacin sobre el quiste. Estos estudios pueden ser:  Ecografas.  Radiografas de la pelvis.  Tomografa computada.  Resonancia magntica.  Anlisis de sangre. TRATAMIENTO  Muchos de los quistes ovricos desaparecen por s solos, sin tratamiento. Es probable que el mdico quiera controlar el quiste regularmente durante 2 o 3meses para ver si se produce algn cambio. En el caso de las mujeres en la menopausia, es particularmente importante controlar de cerca al quiste ya que el ndice de cncer de ovario en las mujeres menopusicas es ms alto. Cuando se requiere tratamiento, este puede incluir cualquiera de los siguientes:  Un procedimiento para drenar el quiste (aspiracin). Esto se puede realizar mediante el uso de una aguja grande y una ecografa. Tambin se puede hacer a travs de un procedimiento laparoscpico, En   este procedimiento, se inserta un tubo delgado que emite luz y que tiene una pequea cmara en un  extremo (laparoscopio) a travs de una pequea incisin.  Ciruga para extirpar el quiste completo. Esto se puede realizar mediante una ciruga laparoscpica o Neomia Dear ciruga abierta, la cual implica realizar una incisin ms grande en la parte inferior del abdomen.  Tratamiento hormonal o pldoras anticonceptivas. Estos mtodos a veces se usan para ayudar a Barista. INSTRUCCIONES PARA EL CUIDADO EN EL HOGAR   Tome solo medicamentos de venta libre o recetados, segn las indicaciones del mdico.  Oceanographer a las consultas de control con su mdico segn las indicaciones.  Hgase exmenes plvicos regulares y pruebas de Papanicolaou. SOLICITE ATENCIN MDICA SI:   Los perodos se atrasan, son irregulares, dolorosos o cesan.  El dolor plvico o abdominal no desaparece.  El abdomen se agranda o se hincha.  Siente presin en la vejiga o no puede vaciarla completamente.  Siente dolor durante las [employer].  Tiene una sensacin de hinchazn, presin o Environmental manager.  Pierde peso sin razn aparente.  Siente un Engineer, maintenance (IT).  Est estreida.  Pierde el apetito.  Le aparece acn.  Nota un aumento del vello corporal y facial.  Lenora Boys de peso sin hacer modificaciones en su actividad fsica y en su dieta habitual.  Sospecha que est embarazada. SOLICITE ATENCIN MDICA DE INMEDIATO SI:   Siente cada vez ms dolor abdominal.  Tiene malestar estomacal (nuseas) y vomita.  Tiene fiebre que se presenta de [place] repentina.  Siente dolor abdominal al defecar.  Sus perodos menstruales son ms abundantes que lo habitual. ASEGRESE DE QUE:   Comprende estas instrucciones.  Controlar su afeccin.  Recibir ayuda de inmediato si no mejora o si empeora. Document Released: 11/04/2004 Document Revised: 01/30/2013 [facility] Patient Information 2015 [place], [place]. This information is not intended to replace advice given to you by your health  care provider. Make sure you discuss any questions you have with your health care provider.  Marcador tumoral CA-125 (CA-125 Tumor Marker) El CA-125 es un marcador tumoral que sirve para Scientist, physiological evolucin del cncer de ovario o de endometrio. PREPARACIN PARA EL ESTUDIO No se requiere Loss adjuster, chartered. HALLAZGOS NORMALES Adultos: 0a35unidades/ml (0a81miliunidades/l) Los rangos para los resultados normales pueden variar entre diferentes laboratorios y hospitales. Consulte siempre con su mdico despus de Production assistant, radio estudio para Artist significado de los [place] y si los valores se consideran "dentro de los lmites normales". SIGNIFICADO DEL ESTUDIO  El mdico leer los resultados y Heritage manager con usted sobre la importancia y el significado de los [place], as como las opciones de tratamiento y la necesidad de Education officer, environmental pruebas adicionales, si fuera necesario. OBTENCIN DE LOS RESULTADOS DE LAS PRUEBAS Es su responsabilidad retirar el resultado del [place]. Consulte en el laboratorio cundo y cmo podr [employer]. Document Released: 11/15/2012 [facility] Patient Information 2015 [place], [place]. This information is not intended to replace advice given to you by your health care provider. Make sure you discuss any questions you have with your health care provider.   Vacuna Gardasil contra el VPH (virus del papiloma humano): lo que debe saber (HPV Vaccine Gardasil (Human Papillomavirus): What You Need to Know) 1. Qu es el VPH? El virus del Geneticist, molecular (VPH) genital es el virus de transmisin sexual ms frecuente en los [place]. Ms de la mitad de los hombres y mujeres sexualmente activos se infectan con el VPH en algn momento de sus  vidas. Alrededor de 20 millones de estadounidenses estn actualmente infectados y cerca de 6 millones ms se infectan cada ao. El VPH se transmite a travs del contacto sexual. Katha HammingLa mayora de las infecciones por el VPH  no causan ningn sntoma y Electronics engineerdesaparecen sin TEFL teachertratamiento. Pero el VPH puede causar cncer de cuello del tero en las mujeres. El cncer de cuello de tero es la segunda causa de muerte por cncer entre las mujeres de todo el [place]. En los [address] [place], cerca de [id number] mujeres contraen cncer de cuello de tero cada ao y alrededor de 4000 mueren a causa de ella. El VPH se asocia con algunos tipos de cncer poco frecuente, como el cncer vaginal y vulvar en la mujer y anal y Dexter Cityorofarngeo (parte posterior de la garganta, incluyendo la base de la lengua y las [place]) en hombres y Ripleymujeres. Tambin puede causar verrugas genitales y en la garganta. No hay cura para la infeccin por VPH, pero algunos de los problemas que causa pueden tratarse. 2. Vale HavenVacuna contra el VPH: por qu vacunarse? La vacuna contra el VPH que est recibiendo es una de dos vacunas que se pueden dar para prevenir el VPH. Se puede administrar a hombres y mujeres.  Esta vacuna puede prevenir la Harley-Davidsonmayora de los casos de cncer de cuello de tero en las mujeres, si se administra antes de la exposicin al virus. Adems, se puede prevenir el cncer vaginal y vulvar en las mujeres, y las verrugas genitales y el cncer anal tanto en hombres como en mujeres. Se espera que la proteccin de la vacuna Administrator, sportscontra el VPH sea Rockfordpermanente. Sin embargo, la vacunacin no es un sustituto de los estudios para Architectural technologistdetectar el cncer de cuello uterino. Las mujeres deben hacerse pruebas de Papanicolaou con regularidad. 3. Quines deben recibir esta vacuna contra el VPH y cundo? La vacuna contra el VPH se administra en una serie de 3 dosis  Primera dosis: ahora  Segunda dosis: de 1 a 2 meses despus de la primera dosis  Tercera dosis: 6meses despus de la primera dosis No se recomiendan dosis adicionales (refuerzos). Vacunacin de rutina  Se recomienda administrar la vacuna contra el VPH a las nias y los nios de 11o 12aos. Se puede administrar a partir de  los [address] aos. Por qu se recomienda aplicar la Animal nutritionistvacuna contra el VPH a los 11 o 12 aos? La infeccin por el VPH se contrae fcilmente, aun cuando se tenga solo un compaero sexual. Por eso, es importante recibir la vacuna contra el VPH antes de todo contacto sexual. Adems, la respuesta a la vacuna es mejor a esta edad que cuando se es Forensic scientistmayor. Actualizacin de la vacuna Esta vacuna se recomienda para quienes no hayan completado la serie de 3 dosis:   Nias entre 13 y 6426 aos de edad.  Varones entre 13 y 21 aos de [address]. Esta vacuna puede aplicarse a varones de 22 a 26aos que no hayan completado la serie de 3 dosis. Se recomienda en varones de 26aos que tengan relaciones sexuales con varones o en aquellos cuyo sistema inmunolgico est debilitado debido a una infeccin por el VIH, otras enfermedades o medicamentos.  Estas vacunas pueden administrarse de manera segura simultneamente con otras vacunas. 4. Algunas personas no deben vacunarse contra el VPH o deben esperar.  Teresita Maduraoda persona que haya sufrido una reaccin alrgica potencialmente mortal a cualquier componente de la vacuna contra el VPH, o a una dosis previa de esta vacuna, no debe recibirla. Informe a su mdico si  la persona que va a recibir la vacuna ha sufrido Environmental consultant graves, incluyendo alergia a las levaduras.  La vacuna contra el VPH no se recomienda en mujeres embarazadas. Sin embargo, si ha recibido Research officer, trade union, no es un motivo para considerar su interrupcin. Las mujeres que amamantan pueden recibir la vacuna.  Las Geologist, engineering en el momento que se ha programado la [place], pueden recibirla. Las personas con una enfermedad moderada o grave, deben Administrator. 5. Cules son los riesgos de esta vacuna? La vacuna contra el VPH se ha [employer] Unidos y en todo el mundo durante seis aos y ha demostrado ser segura. Sin embargo, cualquier medicamento puede causar problemas como  una reaccin alrgica grave. El riesgo de que una vacuna cause daos graves, o la [place], es extremadamente pequeo. Las Therapist, art potencialmente mortales a causa de la vacuna ocurren en raras ocasiones. Si aparecen, generalmente es despus de algunos minutos o algunas horas luego de la aplicacin de la vacuna. Esta vacuna contra el VPH causa diversos problemas de leves a moderados. Estos sntomas no duran mucho y desaparecen sin tratamiento.  Reacciones en el brazo, en el sitio de la inyeccin:  Engineer, mining (alrededor de 8 de cada 10personas)  Enrojecimiento o hinchazn (alrededor de 1de cada 4personas)  Grant Ruts:  Leve (100F) (alrededor de 1 de cada 10 personas)  Moderada (102F) (alrededor de 1 de cada 65 personas)  Otros sntomas:  Dolor de Training and development officer (alrededor de 1 de cada 3personas)  Desmayos: despus de cualquier procedimiento mdico, incluida la vacunacin, pueden ocurrir episodios de Corporate investment banker leves y sntomas relacionados (como sacudidas). Si permanece sentado o recostado durante 15 minutos despus de la vacunacin puede ayudar a [employer] y las lesiones causadas por las cadas. Informe al mdico si el paciente se siente mareado o siente que va a desvanecerse, o tiene cambios en la visin o zumbidos en los odos.  Como todas las [place], seguir siendo controlada para saber si produce efectos inusuales o graves. 6. Qu pasa si hay una reaccin grave? A qu signos debo estar atento?  Observe todo lo que le preocupe, como signos de una reaccin alrgica grave, fiebre muy alta o cambios en el comportamiento. Los signos de Runner, broadcasting/film/video grave pueden incluir ronchas, hinchazn de la cara y la garganta, dificultad para respirar, latidos cardacos acelerados, mareos y debilidad. Pueden comenzar entre unos pocos minutos y algunas horas despus de la vacunacin.  Qu debo hacer?  Si usted piensa que se trata de una reaccin alrgica grave o de otra emergencia  que no puede esperar, llame al 911 o dirjase al hospital ms cercano. Sino, llame a su mdico.  Despus, la reaccin debe informarse al [address] de Informacin sobre Efectos Adversos de las [place] (Vaccine Adverse Event Reporting System, VAERS). Su mdico puede presentar este informe, o puede hacerlo usted mismo a travs del sitio web de VAERS, en www.vaers.LAgents.no, o llamando al [phone]. El VAERS es solo para Biomedical engineer. No brindan consejo mdico. 7. Programa Nacional de Compensacin de Daos por [employer]  El [place] de Compensacin de Daos por Administrator, arts (National Vaccine Injury [employer], VICP) es un programa federal que fue creado para Patent examiner a las personas que puedan haber sufrido daos al recibir ciertas vacunas.  Aquellas personas que consideren que han sufrido un dao como consecuencia de una vacuna y quieren saber ms acerca del programa y cmo presentar Roslynn Amble, pueden llamar al [phone] o visitar su sitio  web en [email or web address]. 8. Cmo puedo obtener ms informacin?  Consulte a su mdico.  Comunquese con el servicio de salud de su localidad o [address].  Comunquese con los Centros para Air traffic controller y la Prevencin de Child psychotherapist for Disease Control and Prevention , CDC).  Llame al [phone] (1-800-CDC-INFO) o  Visite la pgina web de los CDC en PicCapture.uy Declaracin de informacin sobre la vacuna Gardasil (provisional) contra el virus del Geneticist, molecular (VPH) de los CDC 06/25/11 Document Released: 06/13/2008 Document Revised: 06/11/2013 [employer] Patient Information 2015 [place], [place]. This information is not intended to replace advice given to you by your health care provider. Make sure you discuss any questions you have with your health care provider. Informacin sobre el dispositivo intrauterino (Intrauterine Device Information) Un dispositivo intrauterino (DIU) se inserta en el  tero e impide el embarazo. Hay dos tipos de DIU:   DIU de cobre: este tipo de DIU est recubierto con un alambre de cobre y se inserta dentro del tero. El cobre hace que el tero y las trompas de Falopio produzcan un liquido que [employer] espermatozoides. El DIU de cobre puede Geneticist, molecular durante 10 aos.  DIU con hormona: este tipo de DIU contiene la hormona progestina (progesterona sinttica). Las hormonas hacen que el moco cervical se haga ms espeso, lo que evita que el esperma ingrese al tero. Tambin hace que la membrana que recubre internamente al tero sea ms delgada lo que impide el implante del vulo fertilizado. La hormona debilita o destruye los espermatozoides que ingresan al tero. Alguno de los tipos de DIU hormonal pueden Geneticist, molecular durante 5 aos y otros tipos pueden dejarse en el lugar por 3 aos. El mdico se asegurar de que usted sea una buena candidata para usar el DIU. Converse con su mdico acerca de los posibles efectos secundarios.  VENTAJAS DEL DISPOSITIVO INTRAUTERINO  El DIU es muy eficaz, reversible, de accin prolongada y de bajo mantenimiento.  No hay efectos secundarios relacionados con el estrgeno.  El DIU puede ser utilizado durante la Market researcher.  No est asociado con el aumento de [place].  Funciona inmediatamente despus de la insercin.  El DIU hormonal funciona inmediatamente si se inserta dentro de los [address] del inicio del perodo. Ser necesario que utilice un mtodo anticonceptivo adicional durante 7 das si el DIU hormonal se inserta en algn otro momento del ciclo.  El DIU de cobre no interfiere con las hormonas femeninas.  El DIU hormonal puede hacer que los perodos menstruales abundantes se hagan ms ligeros y que haya menos clicos.  El DIU hormonal puede usarse durante 3 a 5 aos.  El DIU de cobre puede usarse durante 10 aos. DESVENTAJAS DEL DISPOSITIVO INTRAUTERINO  El DIU hormonal puede estar asociado con  patrones de sangrado irregular.  El DIU de cobre puede hacer que el flujo menstrual ms abundante y doloroso.  Puede experimentar clicos y sangrado vaginal despus de la insercin. Document Released: 07/15/2009 Document Revised: 09/27/2012 [facility] Patient Information 2015 [place], [place]. This information is not intended to replace advice given to you by your health care provider. Make sure you discuss any questions you have with your health care provider.

## 2013-09-06 LAB — CA 125: CA 125: 6.9 U/mL (ref 0.0–30.2)

## 2013-09-25 ENCOUNTER — Ambulatory Visit: Payer: Self-pay | Admitting: Gynecology

## 2013-11-07 ENCOUNTER — Ambulatory Visit: Payer: BC Managed Care – PPO | Admitting: Gynecology

## 2013-11-07 ENCOUNTER — Other Ambulatory Visit: Payer: Self-pay | Admitting: Gynecology

## 2013-11-07 ENCOUNTER — Ambulatory Visit (INDEPENDENT_AMBULATORY_CARE_PROVIDER_SITE_OTHER): Payer: BC Managed Care – PPO

## 2013-11-07 ENCOUNTER — Ambulatory Visit (INDEPENDENT_AMBULATORY_CARE_PROVIDER_SITE_OTHER): Payer: BC Managed Care – PPO | Admitting: Gynecology

## 2013-11-07 ENCOUNTER — Other Ambulatory Visit: Payer: BC Managed Care – PPO

## 2013-11-07 ENCOUNTER — Encounter: Payer: Self-pay | Admitting: Gynecology

## 2013-11-07 VITALS — BP 112/70

## 2013-11-07 DIAGNOSIS — IMO0002 Reserved for concepts with insufficient information to code with codable children: Secondary | ICD-10-CM

## 2013-11-07 DIAGNOSIS — N83202 Unspecified ovarian cyst, left side: Secondary | ICD-10-CM

## 2013-11-07 DIAGNOSIS — R102 Pelvic and perineal pain: Secondary | ICD-10-CM

## 2013-11-07 DIAGNOSIS — R1031 Right lower quadrant pain: Secondary | ICD-10-CM

## 2013-11-07 DIAGNOSIS — N83209 Unspecified ovarian cyst, unspecified side: Secondary | ICD-10-CM

## 2013-11-07 DIAGNOSIS — Z23 Encounter for immunization: Secondary | ICD-10-CM

## 2013-11-07 DIAGNOSIS — N83201 Unspecified ovarian cyst, right side: Secondary | ICD-10-CM | POA: Insufficient documentation

## 2013-11-07 DIAGNOSIS — N949 Unspecified condition associated with female genital organs and menstrual cycle: Secondary | ICD-10-CM

## 2013-11-07 NOTE — Addendum Note (Signed)
Addended by: Berna SpareASTILLO, Aniela Caniglia A on: 11/07/2013 11:21 AM   Modules accepted: Orders

## 2013-11-07 NOTE — Progress Notes (Signed)
   The patient presented to the office for a 3 month followup on left ovarian cyst that was detected back in July. See previous note for details. Patient today was asymptomatic. She was due also for her second dose of the HPV vaccine. Patient now was stating that she would like to plan on getting pregnant later this year and would like to have the Nexplanon removed.  Ultrasound today: Uterus measured 8.0 x 5.8 x 4.2 cm endometrial stripe 1.4 mm right ovarian sidewall echo free avascular cyst measuring 5.4 x 4.0 x 4.5 cm average size 4.6 cm. Left ovary normal. There was some fluid see in the cul-de-sac. Previous cyst of the left ovary no longer suppressant  Assessment/plan: Resolution of previously seen left ovarian cyst now on the right side. This may be attributed to the progesterone from the Nexplanon. Patient will return back next week to remove the Nexplanon since she is interested in getting pregnant. She will start her prenatal vitamins. We'll repeat the ultrasound in 3 months after removal of the Nexplanon. She received her second dose of the HPV vaccine today and her third and final dose is due in January.

## 2013-11-13 ENCOUNTER — Encounter: Payer: Self-pay | Admitting: Gynecology

## 2013-11-13 ENCOUNTER — Ambulatory Visit (INDEPENDENT_AMBULATORY_CARE_PROVIDER_SITE_OTHER): Payer: BC Managed Care – PPO | Admitting: Gynecology

## 2013-11-13 VITALS — BP 112/70

## 2013-11-13 DIAGNOSIS — Z3169 Encounter for other general counseling and advice on procreation: Secondary | ICD-10-CM

## 2013-11-13 DIAGNOSIS — Z308 Encounter for other contraceptive management: Secondary | ICD-10-CM

## 2013-11-13 DIAGNOSIS — Z23 Encounter for immunization: Secondary | ICD-10-CM

## 2013-11-13 DIAGNOSIS — Z3046 Encounter for surveillance of implantable subdermal contraceptive: Secondary | ICD-10-CM

## 2013-11-13 NOTE — Addendum Note (Signed)
Addended by: Berna SpareASTILLO, BLANCA A on: 11/13/2013 11:34 AM   Modules accepted: Orders

## 2013-11-13 NOTE — Progress Notes (Signed)
   Patient presented to the office today to remove her Nexplanon since she is contemplating not getting pregnant and Nexium months. Patient was seen in the office in September 30 of this year and do note an ultrasound were as follows:  "The patient presented to the office for a 3 month followup on left ovarian cyst that was detected back in July. See previous note for details. Patient today was asymptomatic. She was due also for her second dose of the HPV vaccine."  Ultrasound on 11/07/2013: Uterus measured 8.0 x 5.8 x 4.2 cm endometrial stripe 1.4 mm right ovarian sidewall echo free avascular cyst measuring 5.4 x 4.0 x 4.5 cm average size 4.6 cm. Left ovary normal. There was some fluid see in the cul-de-sac. Previous cyst of the left ovary no longer suppressant   Patient scheduled to return back in 3 months for followup ultrasound on the ovarian cyst.                                                             Nexplanon procedure note (removal)  The patient presented to the office today requesting for removal of her Nexplanon that was placed in the year 2013 on her left arm.   On examination the nexplanon implant was palpated and the distal end  (end  closest to the elbow) was marked. The area was sterilized with Betadine solution. 1% lidocaine was used for local anesthesia and approximately 1 cc  was injected into the site that was marked where  the incision was to be made. The local anesthetic was injected under the implant in an effort to keep it  close to the skin surface. Slight pressure pushing downward was made at the proximal end  of the implant in an effort to stabilize it. A bulge appeared indicating the distal end of the implant. Starting at the distal tip of the implant, a small longitudinal incision of 2 mm was made towards the elbow. By gently pushing the implant toward the incision the tip became visible. Grasping the implant with a curved forcep facilitated in gently removing the  implant. Full confirmation of the entire implant which is 4 cm long was inspected and was intact and was shown to the patient and discarded. After removing the implant, the incision was closed with a Steri-Strip and applying and adhesive Kerlix bandage. Patient will be instructed to remove the pressure bandage in 24 hours and the Steri-Strips in 3-5 days.  Note: This dictation was prepared with  Dragon/digital dictation along withSmart phrase technology. Any transcriptional errors that result from this process are unintentional.

## 2013-12-10 ENCOUNTER — Encounter: Payer: Self-pay | Admitting: Gynecology

## 2014-01-09 ENCOUNTER — Ambulatory Visit (INDEPENDENT_AMBULATORY_CARE_PROVIDER_SITE_OTHER): Payer: BC Managed Care – PPO | Admitting: Gynecology

## 2014-01-09 ENCOUNTER — Other Ambulatory Visit: Payer: Self-pay | Admitting: Gynecology

## 2014-01-09 ENCOUNTER — Encounter: Payer: Self-pay | Admitting: Gynecology

## 2014-01-09 ENCOUNTER — Ambulatory Visit (INDEPENDENT_AMBULATORY_CARE_PROVIDER_SITE_OTHER): Payer: BC Managed Care – PPO

## 2014-01-09 DIAGNOSIS — N8320 Unspecified ovarian cysts: Secondary | ICD-10-CM

## 2014-01-09 DIAGNOSIS — N831 Corpus luteum cyst of ovary, unspecified side: Secondary | ICD-10-CM

## 2014-01-09 DIAGNOSIS — N83201 Unspecified ovarian cyst, right side: Secondary | ICD-10-CM

## 2014-01-09 NOTE — Progress Notes (Signed)
   Patient presented to the office today for follow-up on her right ovarian cyst. Patient was last seen in the office October 6 of this year when she was requesting to remove her Nexplanon since she was contemplating getting pregnant in 2016. She is currently using condoms for contraception and is having normal menstrual cycles. Her history has been as follows:  11/07/2013 ultrasound: Uterus measured 8.0 x 5.8 x 4.2 cm endometrial stripe 1.4 mm right ovarian sidewall echo free avascular cyst measuring 5.4 x 4.0 x 4.5 cm average size 4.6 cm. Left ovary normal. There was some fluid see in the cul-de-sac. Previous cyst of the left ovary no longer suppressant  Ultrasound today: Uterus measured 8.4 x 6.2 x 3.9 cm right ovary normal previous cyst not seen. Left ovary corpus luteum cyst measuring 12 mm with positive color flow the periphery. Some free fluid small amount was seen in the cul-de-sac otherwise normal ultrasound.  Assessment/plan: Complete resolution of right ovarian cyst. Patient using barrier contraception until next year when she attempts to get pregnant. We discussed importance of prenatal vitamins for neural tube defect prophylaxis. Patient will return January for her third and final HPV vaccine to complete the series. She will then attempted to get pregnant in February.

## 2014-02-08 NOTE — L&D Delivery Note (Signed)
Delivery Note At 9:18 PM a viable female was delivered via Vaginal, Spontaneous Delivery (Presentation: Right Occiput Anterior).  APGAR: 9, 9; weight 8 lb 13.6 oz (4015 g).   Placenta status: Intact, Spontaneous.  Cord: 3 vessels with the following complications: None.  Cord pH: none  Anesthesia: None  Episiotomy: None Lacerations: None Suture Repair: none Est. Blood Loss (mL): 350  Mom to postpartum.  Baby to Couplet care / Skin to Skin.  Shamell Hittle A 12/31/2014, 2:43 AM

## 2014-04-25 ENCOUNTER — Encounter (HOSPITAL_COMMUNITY): Payer: Self-pay | Admitting: Emergency Medicine

## 2014-04-25 ENCOUNTER — Emergency Department (HOSPITAL_COMMUNITY)
Admission: EM | Admit: 2014-04-25 | Discharge: 2014-04-25 | Disposition: A | Discharge status: Home / Self Care | Payer: Medicaid Other | Source: Home / Self Care | Attending: Family Medicine | Admitting: Family Medicine

## 2014-04-25 DIAGNOSIS — N390 Urinary tract infection, site not specified: Secondary | ICD-10-CM | POA: Diagnosis not present

## 2014-04-25 DIAGNOSIS — N912 Amenorrhea, unspecified: Secondary | ICD-10-CM

## 2014-04-25 DIAGNOSIS — Z3201 Encounter for pregnancy test, result positive: Secondary | ICD-10-CM

## 2014-04-25 DIAGNOSIS — Z3490 Encounter for supervision of normal pregnancy, unspecified, unspecified trimester: Secondary | ICD-10-CM

## 2014-04-25 LAB — POCT URINALYSIS DIP (DEVICE)
Bilirubin Urine: NEGATIVE
Glucose, UA: NEGATIVE mg/dL
Hgb urine dipstick: NEGATIVE
Ketones, ur: NEGATIVE mg/dL
NITRITE: NEGATIVE
PROTEIN: NEGATIVE mg/dL
Specific Gravity, Urine: 1.025 (ref 1.005–1.030)
Urobilinogen, UA: 0.2 mg/dL (ref 0.0–1.0)
pH: 6 (ref 5.0–8.0)

## 2014-04-25 LAB — POCT PREGNANCY, URINE: Preg Test, Ur: POSITIVE — AB

## 2014-04-25 MED ORDER — NITROFURANTOIN MONOHYD MACRO 100 MG PO CAPS: 100.0000 mg | ORAL_CAPSULE | Freq: Two times a day (BID) | ORAL | Status: DC

## 2014-04-25 NOTE — ED Notes (Signed)
Here for poss preg; LMP = 02/12 Also reports pos home preg test at home  Has been experiencing mild abd cramping x1 week Alert, no signs of acute distress.

## 2014-04-25 NOTE — Discharge Instructions (Signed)
Thank you for coming in today. Restart Prenatal vitamins.  Primer trimestre de Psychiatrist (First Trimester of Pregnancy) El primer trimestre de Psychiatrist se extiende desde la semana1 hasta el final de la semana12 (mes1 al mes3). Una semana despus de que un espermatozoide fecunda un vulo, este se implantar en la pared uterina. Este embrin comenzar a Camera operator convertirse en un beb. Sus genes y los de su pareja forman el beb. Los genes del varn determinan si ser un nio o una nia. Entre la semana6 y [place], se forman los ojos y [place], y los latidos del corazn pueden verse en la ecografa. Al final de las 12semanas, todos los rganos del beb estn formados.  Ahora que est embarazada, querr hacer todo lo que est a su alcance para tener un beb sano. Dos de las cosas ms importantes son Winferd Humphrey buena atencin prenatal y seguir las indicaciones del mdico. La atencin prenatal incluye toda la asistencia mdica que usted recibe antes del nacimiento del beb. Esta ayudar a prevenir, Engineer, manufacturing y tratar cualquier problema durante el embarazo y [place]. CAMBIOS EN EL ORGANISMO Su organismo atraviesa por muchos cambios durante el [place], y estos varan de Neomia Dear mujer a Educational psychologist.   Al principio, puede aumentar o bajar algunos kilos.  Puede tener Programme researcher, broadcasting/film/video (nuseas) y vomitar. Si no puede controlar los vmitos, llame al mdico.  Puede cansarse con facilidad.  Es posible que tenga dolores de cabeza que pueden aliviarse con los medicamentos que el mdico le permita tomar.  Puede orinar con mayor frecuencia. El dolor al orinar puede significar que usted tiene una infeccin de la vejiga.  Debido al [place], puede tener acidez estomacal.  Puede estar estreida, ya que ciertas hormonas enlentecen los movimientos de los msculos que [employer] desechos a travs de los intestinos.  Pueden aparecer hemorroides o abultarse e hincharse las venas (venas varicosas).  Las [employer]  pueden empezar a Government social research officer y Emergency planning/management officer. Los pezones pueden sobresalir ms, y el tejido que los rodea (areola) tornarse ms oscuro.  Las Veterinary surgeon y estar sensibles al cepillado y al hilo dental.  Pueden aparecer zonas oscuras o manchas (cloasma, mscara del Psychiatrist) en el rostro que probablemente se atenuarn despus del nacimiento del beb.  Los perodos menstruales se interrumpirn.  Tal vez no tenga apetito.  Puede sentir un fuerte deseo de consumir ciertos alimentos.  Puede tener cambios a Theatre manager a da, por ejemplo, por momentos puede estar emocionada por el Psychiatrist y por otros preocuparse porque algo pueda salir mal con el embarazo o el beb.  Tendr sueos ms vvidos y extraos.  Tal vez haya cambios en el cabello que pueden incluir su engrosamiento, crecimiento rpido y cambios en la textura. A algunas mujeres tambin se les cae el cabello durante o despus del [place], o tienen el cabello seco o fino. Lo ms probable es que el cabello se le normalice despus del nacimiento del beb. QU DEBE ESPERAR EN LAS CONSULTAS PRENATALES Durante una visita prenatal de rutina:  La pesarn para asegurarse de que usted y el beb estn creciendo normalmente.  Le controlarn la presin arterial.  Le medirn el abdomen para controlar el desarrollo del beb.  Se escucharn los latidos cardacos a partir de la semana10 o la12 de embarazo, aproximadamente.  Se analizarn los resultados de los estudios solicitados en visitas anteriores. El mdico puede preguntarle:  Cmo se siente.  Si siente los movimientos del beb.  Si ha tenido sntomas anormales,  como prdida de lquido, sangrado, dolores de cabeza intensos o clicos abdominales.  Si tiene Colgate-Palmolivealguna pregunta. Otros estudios que pueden realizarse durante el primer trimestre incluyen lo siguiente:  Anlisis de sangre para determinar el tipo de sangre y Engineer, manufacturingdetectar la presencia de infecciones previas.  Adems, se los usar para controlar si los niveles de hierro son bajos (anemia) y Chief Strategy Officerdeterminar los anticuerpos Rh. En una etapa ms avanzada del [place], se harn anlisis de sangre para saber si tiene diabetes, junto con otros estudios si surgen problemas.  Anlisis de orina para detectar infecciones, diabetes o protenas en la orina.  Una ecografa para confirmar que el beb crece y se desarrolla correctamente.  Una amniocentesis para diagnosticar posibles problemas genticos.  Estudios del feto para descartar espina bfida y sndrome de [place].  Es posible que necesite otras pruebas adicionales. INSTRUCCIONES PARA EL CUIDADO EN EL HOGAR  Medicamentos:  Siga las indicaciones del mdico en relacin con el uso de medicamentos. Durante el embarazo, hay medicamentos que pueden tomarse y otros que no.  Tome las vitaminas prenatales como se le indic.  Si est estreida, tome un laxante suave, si el mdico lo [place] Arab Jamahiriyaautoriza. Dieta  Consuma alimentos balanceados. Elija alimentos variados, como carne o protenas de origen vegetal, pescado, leche y productos lcteos descremados, verduras, frutas y panes y Radiation protection practitionercereales integrales. El mdico la ayudar a Production assistant, radiodeterminar la cantidad de peso que puede Valley Parkaumentar.  No coma carne cruda ni quesos sin cocinar. Estos elementos contienen bacterias que pueden causar defectos congnitos en el beb.  La ingesta diaria de cuatro o cinco comidas pequeas en lugar de tres comidas abundantes puede ayudar a Yahooaliviar las nuseas y los vmitos. Si empieza a tener nuseas, comer algunas [address]galletas saladas puede ser de [place] Waterayuda. Beber lquidos National Cityentre las comidas en lugar de tomarlos durante las comidas tambin puede ayudar a Optician, dispensingcalmar las nuseas y los vmitos.  Si est estreida, consuma alimentos con alto contenido de [place] Springfibra, como verduras y frutas frescas, y Radiation protection practitionercereales integrales. Beba suficiente lquido para Photographermantener la orina clara o de color amarillo plido. Actividad y Landscape architectejercicios  Haga  ejercicio solamente como se lo haya indicado el mdico. El ejercicio la ayudar a:  Art gallery managerControlar el peso.  Mantenerse en forma.  Estar preparada para el trabajo de parto y [place]el parto.  Los dolores, los clicos en la parte baja del abdomen o los calambres en la cintura son un buen indicio de que debe dejar de Corporate treasurerhacer ejercicios. Consulte al mdico antes de seguir haciendo ejercicios normales.  Intente no estar de pie FedExdurante mucho tiempo. Mueva las piernas con frecuencia si debe estar de pie en un lugar durante mucho tiempo.  Evite levantar pesos Fortune Brandsexcesivos.  Use zapatos de tacones bajos y Brazilmantenga una buena postura.  Puede seguir teniendo The St. Paul Travelersrelaciones sexuales, excepto que el mdico le indique lo contrario. Alivio del dolor o las molestias  Use un sostn que le brinde buen soporte si siente dolor a la palpacin Mattelen las mamas.  Dese baos de asiento con agua tibia para Engineer, materialsaliviar el dolor o las molestias causadas por las hemorroides. Use crema antihemorroidal si el mdico se lo permite.  Descanse con las piernas elevadas si tiene calambres o dolor de cintura.  Si tiene venas varicosas en las piernas, use medias de descanso. Eleve los pies durante 15minutos, 3 o 4veces por da. Limite la cantidad de sal en su dieta. Cuidados prenatales  Programe las visitas prenatales para la semana12 de [place]embarazo. Generalmente se programan cada mes al principio y  se hacen ms frecuentes en los 2 ltimos meses antes del parto.  Escriba sus preguntas. Llvelas cuando concurra a las visitas prenatales.  Concurra a todas las visitas prenatales como se lo haya indicado el mdico. Seguridad  Colquese el cinturn de seguridad cuando conduzca.  Haga una lista de los nmeros de telfono de Associate Professor, que [employer] nmeros de telfono de familiares, [place], el hospital y los departamentos de polica y bomberos. Consejos generales  Pdale al mdico que la derive a clases de educacin prenatal en su localidad.  Debe comenzar a tomar las clases antes de Cytogeneticist en el mes6 de embarazo.  Pida ayuda si tiene necesidades nutricionales o de asesoramiento Academic librarian. El mdico puede aconsejarla o derivarla a especialistas para que la ayuden con diferentes necesidades.  No se d baos de inmersin en agua caliente, baos turcos ni saunas.  No se haga duchas vaginales ni use tampones o toallas higinicas perfumadas.  No mantenga las piernas cruzadas durante [place].  Evite el contacto con las bandejas sanitarias de los gatos y la tierra que estos animales usan. Estos elementos contienen bacterias que pueden causar defectos congnitos al beb y la posible prdida del feto debido a un aborto espontneo o muerte fetal.  No fume, no consuma hierbas ni medicamentos que no hayan sido recetados por el mdico. Las sustancias qumicas que estos productos contienen afectan la formacin y el desarrollo del beb.  Programe una cita con el dentista. En su casa, lvese los dientes con un cepillo dental blando y psese el hilo dental con suavidad. SOLICITE ATENCIN MDICA SI:   Tiene mareos.  Siente clicos leves, presin en la pelvis o dolor persistente en el abdomen.  Tiene nuseas, vmitos o diarrea persistentes.  Tiene secrecin vaginal con mal olor.  Siente dolor al [employer]ips.  Tiene el rostro, las [place], las piernas o los tobillos ms hinchados. SOLICITE ATENCIN MDICA DE INMEDIATO SI:   Tiene fiebre.  Tiene una prdida de lquido por la vagina.  Tiene sangrado o pequeas prdidas vaginales.  Siente dolor intenso o clicos en el abdomen.  Sube o baja de peso rpidamente.  Vomita sangre de color rojo brillante o material que parezca granos de caf.  Ha estado expuesta a la rubola y no ha sufrido la enfermedad.  Ha estado expuesta a la quinta enfermedad o a la varicela.  Tiene un dolor de cabeza intenso.  Le falta el aire.  Sufre cualquier tipo de traumatismo, por ejemplo, debido  a una cada o un accidente automovilstico. Document Released: 11/04/2004 Document Revised: 06/11/2013 [facility] Patient Information 2015 [place], [place]. This information is not intended to replace advice given to you by your health care provider. Make sure you discuss any questions you have with your health care provider.    Infeccin urinaria  (Urinary Tract Infection)  La infeccin urinaria puede ocurrir en Corporate treasurer del tracto urinario. El tracto urinario es un sistema de drenaje del cuerpo por el que se eliminan los desechos y el exceso de [place]. El tracto urinario est formado por dos riones, dos urteres, la vejiga y Engineer, mining. Los riones son rganos que tienen forma de frijol. Cada rin tiene aproximadamente el tamao del puo. Estn situados debajo de las [employer] [employer], uno a cada lado de la columna vertebral CAUSAS  La causa de la infeccin son los microbios, que son organismos microscpicos, que incluyen hongos, virus, y bacterias. Estos organismos son tan pequeos que slo pueden verse a travs del microscopio. Las bacterias son  los microorganismos que ms comnmente causan infecciones urinarias.  SNTOMAS  Los sntomas pueden variar segn la edad y el sexo del paciente y por la ubicacin de la infeccin. Los sntomas en las mujeres jvenes incluyen la necesidad frecuente e intensa de orinar y una sensacin dolorosa de ardor en la vejiga o en la uretra durante la miccin. Las mujeres y los hombres mayores podrn sentir cansancio, temblores y debilidad y Futures trader musculares y Engineer, mining abdominal. Si tiene [place], puede significar que la infeccin est en los riones. Otros sntomas son dolor en la espalda o en los lados debajo de las [place], nuseas y vmitos.  DIAGNSTICO  Para diagnosticar una infeccin urinaria, el mdico le preguntar acerca de sus sntomas. [employer] una [place] de [place]. La muestra de orina se analiza para Engineer, manufacturing bacterias y glbulos blancos de  Risk manager. Los glbulos blancos se forman en el organismo para ayudar a Artist las infecciones.  TRATAMIENTO  Por lo general, las infecciones urinarias pueden tratarse con medicamentos. Debido a que la [employer]son de las infecciones son causadas por bacterias, por lo general pueden tratarse con antibiticos. La eleccin del antibitico y la duracin del tratamiento depender de sus sntomas y el tipo de bacteria causante de la infeccin.  INSTRUCCIONES PARA EL CUIDADO EN EL HOGAR   Si le recetaron antibiticos, tmelos exactamente como su mdico le indique. Termine el medicamento aunque se sienta mejor despus de haber tomado slo algunos.  Beba gran cantidad de lquido para mantener la orina de tono claro o color amarillo plido.  Evite la cafena, el t y las [address]. Estas sustancias irritan la vejiga.  Vaciar la vejiga con frecuencia. Evite retener la orina durante largos perodos.  Vace la vejiga antes y despus de Management consultant.  Despus de mover el intestino, las mujeres deben higienizarse la regin perineal desde adelante hacia atrs. Use slo un papel tissue por vez. SOLICITE ATENCIN MDICA SI:   Siente dolor en la espalda.  Le sube la fiebre.  Los sntomas no mejoran luego de [address]. SOLICITE ATENCIN MDICA DE INMEDIATO SI:   Siente dolor intenso en la espalda o en la zona inferior del abdomen.  Comienza a sentir escalofros.  Tiene nuseas o vmitos.  Tiene una sensacin continua de quemazn o molestias al [employer]ocoPhillips. ASEGRESE DE QUE:   Comprende estas instrucciones.  Controlar su enfermedad.  Solicitar ayuda de inmediato si no mejora o empeora. Document Released: 11/04/2004 Document Revised: 10/20/2011 [facility] Patient Information 2015 [place], [place]. This information is not intended to replace advice given to you by your health care provider. Make sure you discuss any questions you have with your health care provider.

## 2014-04-25 NOTE — ED Provider Notes (Signed)
Sue Cruz is a 24 y.o. female who presents to Urgent Care today for pregnancy. Patient's LMP was Feb 12th. She has had a positive HPT and has not yet started prenatal vitamins. She notes some mild lower pelvic pressure. She denies any fevers or chills vomiting or diarrhea. She feels well otherwise. She notes urinary frequency and urgency. No bowel bladder dysfunction. She is a G2P1.   History reviewed. No pertinent past medical history. History reviewed. No pertinent past surgical history. History  Substance Use Topics  . Smoking status: Never Smoker   . Smokeless tobacco: Not on file  . Alcohol Use: No   ROS as above Medications: No current facility-administered medications for this encounter.   Current Outpatient Prescriptions  Medication Sig Dispense Refill  . hydrocortisone cream 1 % Apply 1 application topically 3 (three) times daily as needed. For rash/itching    . ibuprofen (ADVIL,MOTRIN) 600 MG tablet Take 1 tablet (600 mg total) by mouth every 6 (six) hours as needed. 30 tablet 0  . nitrofurantoin, macrocrystal-monohydrate, (MACROBID) 100 MG capsule Take 1 capsule (100 mg total) by mouth 2 (two) times daily. 14 capsule 0  . [DISCONTINUED] Etonogestrel (NEXPLANON [place]) Inject 1 application into the skin once. Implanted February 2013     No Known Allergies   Exam:  BP 118/70 mmHg  Pulse 77  Temp(Src) 98.3 F (36.8 C) (Oral)  Resp 20  SpO2 100%  LMP 03/22/2014 Gen: Well NAD HEENT: EOMI,  MMM Lungs: Normal work of breathing. CTABL Heart: RRR no MRG Abd: NABS, Soft. Nondistended, Nontender no CV angle tenderness to percussion Exts: Brisk capillary refill, warm and well perfused.   Results for orders placed or performed during the hospital encounter of 04/25/14 (from the past 24 hour(s))  POCT urinalysis dip (device)     Status: Abnormal   Collection Time: 04/25/14  8:43 AM  Result Value Ref Range   Glucose, UA NEGATIVE NEGATIVE mg/dL   Bilirubin Urine NEGATIVE  NEGATIVE   Ketones, ur NEGATIVE NEGATIVE mg/dL   Specific Gravity, Urine 1.025 1.005 - 1.030   Hgb urine dipstick NEGATIVE NEGATIVE   pH 6.0 5.0 - 8.0   Protein, ur NEGATIVE NEGATIVE mg/dL   Urobilinogen, UA 0.2 0.0 - 1.0 mg/dL   Nitrite NEGATIVE NEGATIVE   Leukocytes, UA SMALL (A) NEGATIVE  Pregnancy, urine POC     Status: Abnormal   Collection Time: 04/25/14  8:44 AM  Result Value Ref Range   Preg Test, Ur POSITIVE (A) NEGATIVE   No results found.  Assessment and Plan: 24 y.o. female with pregnancy and urinary tract infection. Culture pending. Treat with Macrobid. Follow-up with OB/GYN. Start prenatal vitamins.  Discussed warning signs or symptoms. Please see discharge instructions. Patient expresses understanding.     Rodolph BongEvan S Kemi Gell, MD 04/25/14 0930

## 2014-04-26 LAB — URINE CULTURE
COLONY COUNT: NO GROWTH
Culture: NO GROWTH
SPECIAL REQUESTS: NORMAL

## 2014-04-29 LAB — OB RESULTS CONSOLE HIV ANTIBODY (ROUTINE TESTING): HIV: NONREACTIVE

## 2014-04-29 LAB — OB RESULTS CONSOLE HEPATITIS B SURFACE ANTIGEN: HEP B S AG: NEGATIVE

## 2014-04-29 LAB — OB RESULTS CONSOLE RPR: RPR: NONREACTIVE

## 2014-04-29 LAB — OB RESULTS CONSOLE RUBELLA ANTIBODY, IGM: Rubella: IMMUNE

## 2014-05-03 ENCOUNTER — Inpatient Hospital Stay (HOSPITAL_COMMUNITY)
Admission: AD | Admit: 2014-05-03 | Discharge: 2014-05-03 | Disposition: A | Discharge status: Home / Self Care | Payer: Medicaid Other | Source: Ambulatory Visit | Attending: Obstetrics | Admitting: Obstetrics

## 2014-05-03 ENCOUNTER — Inpatient Hospital Stay (HOSPITAL_COMMUNITY): Payer: Medicaid Other

## 2014-05-03 ENCOUNTER — Encounter (HOSPITAL_COMMUNITY): Payer: Self-pay

## 2014-05-03 DIAGNOSIS — O26899 Other specified pregnancy related conditions, unspecified trimester: Secondary | ICD-10-CM

## 2014-05-03 DIAGNOSIS — R109 Unspecified abdominal pain: Secondary | ICD-10-CM | POA: Insufficient documentation

## 2014-05-03 DIAGNOSIS — Z3A01 Less than 8 weeks gestation of pregnancy: Secondary | ICD-10-CM | POA: Diagnosis not present

## 2014-05-03 DIAGNOSIS — R51 Headache: Secondary | ICD-10-CM | POA: Diagnosis present

## 2014-05-03 DIAGNOSIS — O9989 Other specified diseases and conditions complicating pregnancy, childbirth and the puerperium: Secondary | ICD-10-CM | POA: Diagnosis not present

## 2014-05-03 DIAGNOSIS — O219 Vomiting of pregnancy, unspecified: Secondary | ICD-10-CM | POA: Diagnosis not present

## 2014-05-03 HISTORY — DX: Other specified health status: Z78.9

## 2014-05-03 LAB — COMPREHENSIVE METABOLIC PANEL
ALT: 15 U/L (ref 0–35)
AST: 17 U/L (ref 0–37)
Albumin: 4.1 g/dL (ref 3.5–5.2)
Alkaline Phosphatase: 64 U/L (ref 39–117)
Anion gap: 8 (ref 5–15)
BUN: 7 mg/dL (ref 6–23)
CO2: 27 mmol/L (ref 19–32)
Calcium: 9.3 mg/dL (ref 8.4–10.5)
Chloride: 102 mmol/L (ref 96–112)
Creatinine, Ser: 0.57 mg/dL (ref 0.50–1.10)
GFR calc Af Amer: >90 mL/min (ref >90)
GFR calc non Af Amer: >90 mL/min (ref >90)
Glucose, Bld: 98 mg/dL (ref 70–99)
Potassium: 3.6 mmol/L (ref 3.5–5.1)
Sodium: 137 mmol/L (ref 135–145)
Total Bilirubin: 0.2 mg/dL — ABNORMAL LOW (ref 0.3–1.2)
Total Protein: 7.5 g/dL (ref 6.0–8.3)

## 2014-05-03 LAB — URINALYSIS, ROUTINE W REFLEX MICROSCOPIC
Bilirubin Urine: NEGATIVE
GLUCOSE, UA: NEGATIVE mg/dL
Hgb urine dipstick: NEGATIVE
KETONES UR: NEGATIVE mg/dL
Nitrite: NEGATIVE
PROTEIN: NEGATIVE mg/dL
Specific Gravity, Urine: 1.02 (ref 1.005–1.030)
Urobilinogen, UA: 0.2 mg/dL (ref 0.0–1.0)
pH: 6 (ref 5.0–8.0)

## 2014-05-03 LAB — URINE MICROSCOPIC-ADD ON

## 2014-05-03 LAB — WET PREP, GENITAL
Clue Cells Wet Prep HPF POC: NONE SEEN
Trich, Wet Prep: NONE SEEN
Yeast Wet Prep HPF POC: NONE SEEN

## 2014-05-03 LAB — CBC
HCT: 39 % (ref 36.0–46.0)
Hemoglobin: 12.9 g/dL (ref 12.0–15.0)
MCH: 27.2 pg (ref 26.0–34.0)
MCHC: 33.1 g/dL (ref 30.0–36.0)
MCV: 82.3 fL (ref 78.0–100.0)
Platelets: 287 10*3/uL (ref 150–400)
RBC: 4.74 MIL/uL (ref 3.87–5.11)
RDW: 13 % (ref 11.5–15.5)
WBC: 10.8 10*3/uL — ABNORMAL HIGH (ref 4.0–10.5)

## 2014-05-03 LAB — HCG, QUANTITATIVE, PREGNANCY: hCG, Beta Chain, Quant, S: 41260 m[IU]/mL — ABNORMAL HIGH (ref <5)

## 2014-05-03 LAB — ABO/RH: ABO/RH(D): O POS

## 2014-05-03 MED ORDER — PROMETHAZINE HCL 25 MG PO TABS
25.0000 mg | ORAL_TABLET | Freq: Once | ORAL | Status: DC
  Filled 2014-05-03: qty 1

## 2014-05-03 MED ORDER — BUTALBITAL-APAP-CAFFEINE 50-325-40 MG PO TABS
1.0000 | ORAL_TABLET | Freq: Once | ORAL | Status: AC
  Administered 2014-05-03: 1 via ORAL
  Filled 2014-05-03: qty 1

## 2014-05-03 MED ORDER — PROMETHAZINE HCL 25 MG PO TABS: 25.0000 mg | ORAL_TABLET | Freq: Four times a day (QID) | ORAL | Status: DC | PRN

## 2014-05-03 NOTE — MAU Note (Signed)
Pt presents complaining of a headache and nausea. States she has a headache every day but has not tried anything for the pain. Does not drink much water. Denies vaginal bleeding or discharge. Reports abdominal pain as well.

## 2014-05-03 NOTE — MAU Provider Note (Signed)
History     CSN: [id number]  Arrival date and time: 05/03/14 1412   First Provider Initiated Contact with Patient 05/03/14 1809      Chief Complaint  Patient presents with  . Nausea  . Headache   Headache  This is a new problem. The current episode started in the past 7 days. The problem occurs constantly. The problem has been unchanged. The pain is located in the frontal region. The pain does not radiate. The pain quality is not similar to prior headaches. The quality of the pain is described as band-like. The pain is moderate. Associated symptoms include abdominal pain, nausea and sinus pressure. The symptoms are aggravated by unknown. She has tried nothing for the symptoms.    24 y.o. G2P1 @[redacted]w[redacted]d   Pt presents complaining of a headache and nausea. States she has a headache every day but has not tried anything for the pain. Does not drink much water. Denies vaginal bleeding or discharge. Reports abdominal pain as well.            Past Medical History  Diagnosis Date  . Medical history non-contributory     Past Surgical History  Procedure Laterality Date  . No past surgeries      History reviewed. No pertinent family history.  History  Substance Use Topics  . Smoking status: Never Smoker   . Smokeless tobacco: Not on file  . Alcohol Use: No    Allergies: No Known Allergies  Prescriptions prior to admission  Medication Sig Dispense Refill Last Dose  . nitrofurantoin, macrocrystal-monohydrate, (MACROBID) 100 MG capsule Take 1 capsule (100 mg total) by mouth 2 (two) times daily. 14 capsule 0 05/02/2014 at Unknown time  . Prenatal Vit-Fe Fumarate-FA (PRENATAL MULTIVITAMIN) TABS tablet Take 1 tablet by mouth daily at 12 noon.   05/02/2014 at Unknown time  . ibuprofen (ADVIL,MOTRIN) 600 MG tablet Take 1 tablet (600 mg total) by mouth every 6 (six) hours as needed. (Patient not taking: Reported on 05/03/2014) 30 tablet 0 Not Taking at Unknown time    Review of Systems   Constitutional: Negative.   HENT: Positive for sinus pressure.   Eyes: Negative.   Gastrointestinal: Positive for nausea and abdominal pain.  Genitourinary: Negative.   Musculoskeletal: Negative.   Skin: Negative.   Neurological: Negative.   Endo/Heme/Allergies: Negative.   Psychiatric/Behavioral: Negative.    Physical Exam   Blood pressure 124/67, pulse 94, temperature 98 F (36.7 C), temperature source Oral, resp. rate 18, height 4\' 9"  (1.448 m), weight 67.858 kg (149 lb 9.6 oz), last menstrual period 03/22/2014.  Physical Exam  Nursing note and vitals reviewed. Constitutional: She is oriented to person, place, and time. She appears well-developed and well-nourished. No distress.  HENT:  Head: Normocephalic and atraumatic.  Neck: Normal range of motion.  Cardiovascular: Normal rate.   Respiratory: She is in respiratory distress.  GI: Soft.  Genitourinary: Uterus is enlarged. Cervix exhibits discharge. Cervix exhibits no motion tenderness and no friability. Vaginal discharge found.  Musculoskeletal: Normal range of motion.  Neurological: She is alert and oriented to person, place, and time.  Skin: Skin is warm and dry.  Psychiatric: She has a normal mood and affect. Her behavior is normal. Judgment and thought content normal.    MAU Course  Procedures  MDM  Pt is driving and declined medication (Phenergan) and states she will get RX filled on her way home Fioricet for headache Pelvic Exam- Neg GC/Chlamydia  Wet Prep Results for orders placed or  performed during the hospital encounter of 05/03/14 (from the past 24 hour(s))  Urinalysis, Routine w reflex microscopic     Status: Abnormal   Collection Time: 05/03/14  3:02 PM  Result Value Ref Range   Color, Urine YELLOW YELLOW   APPearance CLEAR CLEAR   Specific Gravity, Urine 1.020 1.005 - 1.030   pH 6.0 5.0 - 8.0   Glucose, UA NEGATIVE NEGATIVE mg/dL   Hgb urine dipstick NEGATIVE NEGATIVE   Bilirubin Urine NEGATIVE  NEGATIVE   Ketones, ur NEGATIVE NEGATIVE mg/dL   Protein, ur NEGATIVE NEGATIVE mg/dL   Urobilinogen, UA 0.2 0.0 - 1.0 mg/dL   Nitrite NEGATIVE NEGATIVE   Leukocytes, UA SMALL (A) NEGATIVE  Urine microscopic-add on     Status: Abnormal   Collection Time: 05/03/14  3:02 PM  Result Value Ref Range   Squamous Epithelial / LPF FEW (A) RARE   WBC, UA 0-2 <3 WBC/hpf   RBC / HPF 0-2 <3 RBC/hpf   Bacteria, UA MANY (A) RARE  Comprehensive metabolic panel     Status: Abnormal   Collection Time: 05/03/14  4:44 PM  Result Value Ref Range   Sodium 137 135 - 145 mmol/L   Potassium 3.6 3.5 - 5.1 mmol/L   Chloride 102 96 - 112 mmol/L   CO2 27 19 - 32 mmol/L   Glucose, Bld 98 70 - 99 mg/dL   BUN 7 6 - 23 mg/dL   Creatinine, Ser 1.61 0.50 - 1.10 mg/dL   Calcium 9.3 8.4 - 09.6 mg/dL   Total Protein 7.5 6.0 - 8.3 g/dL   Albumin 4.1 3.5 - 5.2 g/dL   AST 17 0 - 37 U/L   ALT 15 0 - 35 U/L   Alkaline Phosphatase 64 39 - 117 U/L   Total Bilirubin 0.2 (L) 0.3 - 1.2 mg/dL   GFR calc non Af Amer >90 >90 mL/min   GFR calc Af Amer >90 >90 mL/min   Anion gap 8 5 - 15  CBC     Status: Abnormal   Collection Time: 05/03/14  4:44 PM  Result Value Ref Range   WBC 10.8 (H) 4.0 - 10.5 K/uL   RBC 4.74 3.87 - 5.11 MIL/uL   Hemoglobin 12.9 12.0 - 15.0 g/dL   HCT 04.5 40.9 - 81.1 %   MCV 82.3 78.0 - 100.0 fL   MCH 27.2 26.0 - 34.0 pg   MCHC 33.1 30.0 - 36.0 g/dL   RDW 91.4 78.2 - 95.6 %   Platelets 287 150 - 400 K/uL  hCG, quantitative, pregnancy     Status: Abnormal   Collection Time: 05/03/14  4:44 PM  Result Value Ref Range   hCG, Beta Chain, Quant, S 41260 (H) <5 mIU/mL  US Ob Comp Less 14 Wks  05/03/2014   CLINICAL DATA:  One week history of pelvic pain  EXAM: OBSTETRIC <14 WK [place] AND TRANSVAGINAL OB US  TECHNIQUE: Both transabdominal and transvaginal ultrasound examinations were performed for complete evaluation of the gestation as well as the maternal uterus, adnexal regions, and pelvic cul-de-sac.  Transvaginal technique was performed to assess early pregnancy.  COMPARISON:  None.  FINDINGS: Intrauterine gestational sac: Visualized/normal in shape.  Yolk sac:  Visualized  Embryo:  Visualized  Cardiac Activity: Visualized  Heart Rate: 116  bpm  CRL:  6  mm   6 w   3 d                  [place] EDC:  December 24, 2014  Maternal uterus/adnexae: There is a 7 x 4 mm subchorionic hemorrhage. Cervical os is closed.  There is a corpus luteum arising from the left ovary measuring 2.0 x 1.0 x 1.6 cm. No other extrauterine pelvic or adnexal mass is seen. There is no free pelvic fluid.  IMPRESSION: Single live intrauterine gestation with estimated gestational age of 6+ weeks. Subcentimeter subchorionic hemorrhage noted. Physiologic corpus luteum and left ovary. No other extrauterine pelvic or adnexal mass. No free pelvic fluid.   Electronically Signed   By: Bretta Bang III M.D.   On: 05/03/2014 18:56   US Ob Transvaginal  05/03/2014   CLINICAL DATA:  One week history of pelvic pain  EXAM: OBSTETRIC <14 WK [place] AND TRANSVAGINAL OB US  TECHNIQUE: Both transabdominal and transvaginal ultrasound examinations were performed for complete evaluation of the gestation as well as the maternal uterus, adnexal regions, and pelvic cul-de-sac. Transvaginal technique was performed to assess early pregnancy.  COMPARISON:  None.  FINDINGS: Intrauterine gestational sac: Visualized/normal in shape.  Yolk sac:  Visualized  Embryo:  Visualized  Cardiac Activity: Visualized  Heart Rate: 116  bpm  CRL:  6  mm   6 w   3 d                  [place] EDC: December 24, 2014  Maternal uterus/adnexae: There is a 7 x 4 mm subchorionic hemorrhage. Cervical os is closed.  There is a corpus luteum arising from the left ovary measuring 2.0 x 1.0 x 1.6 cm. No other extrauterine pelvic or adnexal mass is seen. There is no free pelvic fluid.  IMPRESSION: Single live intrauterine gestation with estimated gestational age of 6+ weeks. Subcentimeter subchorionic  hemorrhage noted. Physiologic corpus luteum and left ovary. No other extrauterine pelvic or adnexal mass. No free pelvic fluid.   Electronically Signed   By: Bretta Bang III M.D.   On: 05/03/2014 18:56    Assessment and Plan  N/V of pregnancy Abdominal Pain in pregnancy  RX Phenergan Discharge to home Follow up with Dr Gaynell Face for prenatal care.  Clemmons,Lori Grissett 05/03/2014, 6:21 PM

## 2014-05-03 NOTE — Discharge Instructions (Signed)

## 2014-05-06 LAB — GC/CHLAMYDIA PROBE AMP (~~LOC~~) NOT AT ARMC
Chlamydia: NEGATIVE
Neisseria Gonorrhea: NEGATIVE

## 2014-05-09 LAB — OB RESULTS CONSOLE ANTIBODY SCREEN: ANTIBODY SCREEN: NEGATIVE

## 2014-06-12 ENCOUNTER — Inpatient Hospital Stay (HOSPITAL_COMMUNITY)
Admission: AD | Admit: 2014-06-12 | Discharge: 2014-06-12 | Disposition: A | Discharge status: Home / Self Care | Payer: Medicaid Other | Source: Ambulatory Visit | Attending: Obstetrics | Admitting: Obstetrics

## 2014-06-12 ENCOUNTER — Encounter (HOSPITAL_COMMUNITY): Payer: Self-pay

## 2014-06-12 DIAGNOSIS — R1030 Lower abdominal pain, unspecified: Secondary | ICD-10-CM | POA: Diagnosis present

## 2014-06-12 DIAGNOSIS — R109 Unspecified abdominal pain: Secondary | ICD-10-CM

## 2014-06-12 DIAGNOSIS — O26899 Other specified pregnancy related conditions, unspecified trimester: Secondary | ICD-10-CM

## 2014-06-12 DIAGNOSIS — O9989 Other specified diseases and conditions complicating pregnancy, childbirth and the puerperium: Secondary | ICD-10-CM | POA: Diagnosis not present

## 2014-06-12 DIAGNOSIS — Z3A11 11 weeks gestation of pregnancy: Secondary | ICD-10-CM | POA: Diagnosis not present

## 2014-06-12 LAB — URINE MICROSCOPIC-ADD ON

## 2014-06-12 LAB — WET PREP, GENITAL
Clue Cells Wet Prep HPF POC: NONE SEEN
TRICH WET PREP: NONE SEEN
Yeast Wet Prep HPF POC: NONE SEEN

## 2014-06-12 LAB — URINALYSIS, ROUTINE W REFLEX MICROSCOPIC
BILIRUBIN URINE: NEGATIVE
Glucose, UA: NEGATIVE mg/dL
Hgb urine dipstick: NEGATIVE
Ketones, ur: NEGATIVE mg/dL
NITRITE: NEGATIVE
PROTEIN: NEGATIVE mg/dL
UROBILINOGEN UA: 0.2 mg/dL (ref 0.0–1.0)
pH: 6 (ref 5.0–8.0)

## 2014-06-12 MED ORDER — ACETAMINOPHEN 500 MG PO TABS
1000.0000 mg | ORAL_TABLET | Freq: Once | ORAL | Status: AC
  Administered 2014-06-12: 1000 mg via ORAL
  Filled 2014-06-12: qty 2

## 2014-06-12 NOTE — Discharge Instructions (Signed)
Abdominal Pain During Pregnancy °Belly (abdominal) pain is common during pregnancy. Most of the time, it is not a serious problem. Other times, it can be a sign that something is wrong with the pregnancy. Always tell your doctor if you have belly pain. °HOME CARE °Monitor your belly pain for any changes. The following actions may help you feel better: °· Do not have sex (intercourse) or put anything in your vagina until you feel better. °· Rest until your pain stops. °· Drink clear fluids if you feel sick to your stomach (nauseous). Do not eat solid food until you feel better. °· Only take medicine as told by your doctor. °· Keep all doctor visits as told. °GET HELP RIGHT AWAY IF:  °· You are bleeding, leaking fluid, or pieces of tissue come out of your vagina. °· You have more pain or cramping. °· You keep throwing up (vomiting). °· You have pain when you pee (urinate) or have blood in your pee. °· You have a fever. °· You do not feel your baby moving as much. °· You feel very weak or feel like passing out. °· You have trouble breathing, with or without belly pain. °· You have a very bad headache and belly pain. °· You have fluid leaking from your vagina and belly pain. °· You keep having watery poop (diarrhea). °· Your belly pain does not go away after resting, or the pain gets worse. °MAKE SURE YOU:  °· Understand these instructions. °· Will watch your condition. °· Will get help right away if you are not doing well or get worse. °Document Released: 01/13/2009 Document Revised: 09/27/2012 Document Reviewed: 08/24/2012 °ExitCare® Patient Information ©2015 [employer]. This information is not intended to replace advice given to you by your health care provider. Make sure you discuss any questions you have with your health care provider. ° °

## 2014-06-12 NOTE — MAU Provider Note (Signed)
History     CSN: [id number]  Arrival date and time: 06/12/14 0154   First Provider Initiated Contact with Patient 06/12/14 0225      Chief Complaint  Patient presents with  . Abdominal Pain   HPI  Sue Cruz is a 24 y.o. G2P1 at 5094w5d who presents to MAU today with complain of lower abdominal pain since 2200 last night. The patient denies vaginal bleeding, discharge, LOF or UTI symptoms. She has had some N/V throughout the pregnancy and is taking Phenergan with some relief. Last dose at 2000 last night. She denies diarrhea, constipation, heartburn, fever or recent intercourse. She states pain is worse with change or positions and improves somewhat with rest. She has not tried any pain medications.   OB History    Gravida Para Term Preterm AB TAB SAB Ectopic Multiple Living   2 1        1       Past Medical History  Diagnosis Date  . Medical history non-contributory     Past Surgical History  Procedure Laterality Date  . No past surgeries      History reviewed. No pertinent family history.  History  Substance Use Topics  . Smoking status: Never Smoker   . Smokeless tobacco: Not on file  . Alcohol Use: No    Allergies: No Known Allergies  Prescriptions prior to admission  Medication Sig Dispense Refill Last Dose  . Prenatal Vit-Fe Fumarate-FA (PRENATAL MULTIVITAMIN) TABS tablet Take 1 tablet by mouth daily at 12 noon.   Past Month at Unknown time  . promethazine (PHENERGAN) 25 MG tablet Take 1 tablet (25 mg total) by mouth every 6 (six) hours as needed for nausea or vomiting. 30 tablet 0 06/11/2014 at Unknown time  . nitrofurantoin, macrocrystal-monohydrate, (MACROBID) 100 MG capsule Take 1 capsule (100 mg total) by mouth 2 (two) times daily. 14 capsule 0 05/02/2014 at Unknown time    Review of Systems  Constitutional: Negative for fever and malaise/fatigue.  Gastrointestinal: Positive for nausea, vomiting and abdominal pain. Negative for diarrhea and  constipation.  Genitourinary: Negative for dysuria, urgency and frequency.       Neg - vaginal bleeding, discharge   Physical Exam   Blood pressure 115/56, pulse 115, temperature 98.3 F (36.8 C), temperature source Oral, resp. rate 16, height 4\' 9"  (1.448 m), weight 154 lb (69.854 kg), last menstrual period 03/22/2014, SpO2 100 %.  Physical Exam  Nursing note and vitals reviewed. Constitutional: She is oriented to person, place, and time. She appears well-developed and well-nourished. No distress.  HENT:  Head: Normocephalic and atraumatic.  Cardiovascular: Normal rate.   Respiratory: Effort normal.  GI: Soft. Bowel sounds are normal. She exhibits no distension and no mass. There is tenderness (mild tednerness to palpation of the lower abdomen bilaterally). There is no rebound and no guarding.  Genitourinary: Uterus is enlarged (appropriate for GA). Uterus is not tender. Cervix exhibits no motion tenderness, no discharge and no friability. No bleeding in the vagina. Vaginal discharge (scant thin, white discharge noted) found.  Cervix: closed, thick, posterior  Neurological: She is alert and oriented to person, place, and time.  Skin: Skin is warm and dry. No erythema.  Psychiatric: She has a normal mood and affect.   Results for orders placed or performed during the hospital encounter of 06/12/14 (from the past 24 hour(s))  Urinalysis, Routine w reflex microscopic     Status: Abnormal   Collection Time: 06/12/14  2:14 AM  Result Value Ref Range   Color, Urine YELLOW YELLOW   APPearance CLEAR CLEAR   Specific Gravity, Urine >1.030 (H) 1.005 - 1.030   pH 6.0 5.0 - 8.0   Glucose, UA NEGATIVE NEGATIVE mg/dL   Hgb urine dipstick NEGATIVE NEGATIVE   Bilirubin Urine NEGATIVE NEGATIVE   Ketones, ur NEGATIVE NEGATIVE mg/dL   Protein, ur NEGATIVE NEGATIVE mg/dL   Urobilinogen, UA 0.2 0.0 - 1.0 mg/dL   Nitrite NEGATIVE NEGATIVE   Leukocytes, UA TRACE (A) NEGATIVE  Urine microscopic-add  on     Status: Abnormal   Collection Time: 06/12/14  2:14 AM  Result Value Ref Range   Squamous Epithelial / LPF FEW (A) RARE   WBC, UA 3-6 <3 WBC/hpf   Bacteria, UA RARE RARE  Wet prep, genital     Status: Abnormal   Collection Time: 06/12/14  2:34 AM  Result Value Ref Range   Yeast Wet Prep HPF POC NONE SEEN NONE SEEN   Trich, Wet Prep NONE SEEN NONE SEEN   Clue Cells Wet Prep HPF POC NONE SEEN NONE SEEN   WBC, Wet Prep HPF POC FEW (A) NONE SEEN     MAU Course  Procedures None  MDM FHR - 164 bpm with doppler UA, wet prep today 1000 mg Tylenol given  No infectious process noted. Cervix closed, thick. +FHTs  Assessment and Plan  A: SIUP at 5519w5d Abdominal pain in pregnancy, first trimester  P: Discharge home Tylenol PRN for pain advised First trimester warning signs discussed Patient advised to follow-up with Dr. Gaynell FaceMarshall as scheduled for routine prenatal care or sooner PRN Patient may return to MAU as needed or if her condition were to change or worsen   Marny LowensteinJulie N Evelia Waskey, PA-C  06/12/2014, 2:57 AM

## 2014-06-12 NOTE — MAU Note (Signed)
Abd pain since 10 pm.  Worse now.  No bleeding. No leaking.

## 2014-09-10 LAB — OB RESULTS CONSOLE GC/CHLAMYDIA
Chlamydia: NEGATIVE
Gonorrhea: NEGATIVE

## 2014-09-12 ENCOUNTER — Encounter (HOSPITAL_COMMUNITY): Payer: Self-pay | Admitting: *Deleted

## 2014-09-12 ENCOUNTER — Inpatient Hospital Stay (HOSPITAL_COMMUNITY)
Admission: AD | Admit: 2014-09-12 | Discharge: 2014-09-12 | Disposition: A | Discharge status: Home / Self Care | Payer: Medicaid Other | Source: Ambulatory Visit | Attending: Obstetrics | Admitting: Obstetrics

## 2014-09-12 DIAGNOSIS — Z20828 Contact with and (suspected) exposure to other viral communicable diseases: Secondary | ICD-10-CM | POA: Diagnosis not present

## 2014-09-12 DIAGNOSIS — O9989 Other specified diseases and conditions complicating pregnancy, childbirth and the puerperium: Secondary | ICD-10-CM | POA: Diagnosis present

## 2014-09-12 DIAGNOSIS — T7589XA Other specified effects of external causes, initial encounter: Secondary | ICD-10-CM

## 2014-09-12 DIAGNOSIS — IMO0001 Reserved for inherently not codable concepts without codable children: Secondary | ICD-10-CM

## 2014-09-12 DIAGNOSIS — Z3A24 24 weeks gestation of pregnancy: Secondary | ICD-10-CM | POA: Diagnosis not present

## 2014-09-12 LAB — URINALYSIS, ROUTINE W REFLEX MICROSCOPIC
Bilirubin Urine: NEGATIVE
Glucose, UA: NEGATIVE mg/dL
Hgb urine dipstick: NEGATIVE
KETONES UR: NEGATIVE mg/dL
Leukocytes, UA: NEGATIVE
NITRITE: NEGATIVE
Protein, ur: NEGATIVE mg/dL
SPECIFIC GRAVITY, URINE: 1.02 (ref 1.005–1.030)
UROBILINOGEN UA: 0.2 mg/dL (ref 0.0–1.0)
pH: 6 (ref 5.0–8.0)

## 2014-09-12 NOTE — Discharge Instructions (Signed)
Third Trimester of Pregnancy The third trimester is from week 29 through week 42, months 7 through 9. The third trimester is a time when the fetus is growing rapidly. At the end of the ninth month, the fetus is about 20 inches in length and weighs 6-10 pounds.  BODY CHANGES Your body goes through many changes during pregnancy. The changes vary from woman to woman.   Your weight will continue to increase. You can expect to gain 25-35 pounds (11-16 kg) by the end of the pregnancy.  You may begin to get stretch marks on your hips, abdomen, and breasts.  You may urinate more often because the fetus is moving lower into your pelvis and pressing on your bladder.  You may develop or continue to have heartburn as a result of your pregnancy.  You may develop constipation because certain hormones are causing the muscles that push waste through your intestines to slow down.  You may develop hemorrhoids or swollen, bulging veins (varicose veins).  You may have pelvic pain because of the weight gain and pregnancy hormones relaxing your joints between the bones in your pelvis. Backaches may result from overexertion of the muscles supporting your posture.  You may have changes in your hair. These can include thickening of your hair, rapid growth, and changes in texture. Some women also have hair loss during or after pregnancy, or hair that feels dry or thin. Your hair will most likely return to normal after your baby is born.  Your breasts will continue to grow and be tender. A yellow discharge may leak from your breasts called colostrum.  Your belly button may stick out.  You may feel short of breath because of your expanding uterus.  You may notice the fetus "dropping," or moving lower in your abdomen.  You may have a bloody mucus discharge. This usually occurs a few days to a week before labor begins.  Your cervix becomes thin and soft (effaced) near your due date. WHAT TO EXPECT AT YOUR PRENATAL  EXAMS  You will have prenatal exams every 2 weeks until week 36. Then, you will have weekly prenatal exams. During a routine prenatal visit:  You will be weighed to make sure you and the fetus are growing normally.  Your blood pressure is taken.  Your abdomen will be measured to track your baby's growth.  The fetal heartbeat will be listened to.  Any test results from the previous visit will be discussed.  You may have a cervical check near your due date to see if you have effaced. At around 36 weeks, your caregiver will check your cervix. At the same time, your caregiver will also perform a test on the secretions of the vaginal tissue. This test is to determine if a type of bacteria, Group B streptococcus, is present. Your caregiver will explain this further. Your caregiver may ask you:  What your birth plan is.  How you are feeling.  If you are feeling the baby move.  If you have had any abnormal symptoms, such as leaking fluid, bleeding, severe headaches, or abdominal cramping.  If you have any questions. Other tests or screenings that may be performed during your third trimester include:  Blood tests that check for low iron levels (anemia).  Fetal testing to check the health, activity level, and growth of the fetus. Testing is done if you have certain medical conditions or if there are problems during the pregnancy. FALSE LABOR You may feel small, irregular contractions that   eventually go away. These are called Braxton Hicks contractions, or false labor. Contractions may last for hours, days, or even weeks before true labor sets in. If contractions come at regular intervals, intensify, or become painful, it is best to be seen by your caregiver.  SIGNS OF LABOR   Menstrual-like cramps.  Contractions that are 5 minutes apart or less.  Contractions that start on the top of the uterus and spread down to the lower abdomen and back.  A sense of increased pelvic pressure or back  pain.  A watery or bloody mucus discharge that comes from the vagina. If you have any of these signs before the 37th week of pregnancy, call your caregiver right away. You need to go to the hospital to get checked immediately. HOME CARE INSTRUCTIONS   Avoid all smoking, herbs, alcohol, and unprescribed drugs. These chemicals affect the formation and growth of the baby.  Follow your caregiver's instructions regarding medicine use. There are medicines that are either safe or unsafe to take during pregnancy.  Exercise only as directed by your caregiver. Experiencing uterine cramps is a good sign to stop exercising.  Continue to eat regular, healthy meals.  Wear a good support bra for breast tenderness.  Do not use hot tubs, steam rooms, or saunas.  Wear your seat belt at all times when driving.  Avoid raw meat, uncooked cheese, cat litter boxes, and soil used by cats. These carry germs that can cause birth defects in the baby.  Take your prenatal vitamins.  Try taking a stool softener (if your caregiver approves) if you develop constipation. Eat more high-fiber foods, such as fresh vegetables or fruit and whole grains. Drink plenty of fluids to keep your urine clear or pale yellow.  Take warm sitz baths to soothe any pain or discomfort caused by hemorrhoids. Use hemorrhoid cream if your caregiver approves.  If you develop varicose veins, wear support hose. Elevate your feet for 15 minutes, 3-4 times a day. Limit salt in your diet.  Avoid heavy lifting, wear low heal shoes, and practice good posture.  Rest a lot with your legs elevated if you have leg cramps or low back pain.  Visit your dentist if you have not gone during your pregnancy. Use a soft toothbrush to brush your teeth and be gentle when you floss.  A sexual relationship may be continued unless your caregiver directs you otherwise.  Do not travel far distances unless it is absolutely necessary and only with the approval  of your caregiver.  Take prenatal classes to understand, practice, and ask questions about the labor and delivery.  Make a trial run to the hospital.  Pack your hospital bag.  Prepare the baby's nursery.  Continue to go to all your prenatal visits as directed by your caregiver. SEEK MEDICAL CARE IF:  You are unsure if you are in labor or if your water has broken.  You have dizziness.  You have mild pelvic cramps, pelvic pressure, or nagging pain in your abdominal area.  You have persistent nausea, vomiting, or diarrhea.  You have a bad smelling vaginal discharge.  You have pain with urination. SEEK IMMEDIATE MEDICAL CARE IF:   You have a fever.  You are leaking fluid from your vagina.  You have spotting or bleeding from your vagina.  You have severe abdominal cramping or pain.  You have rapid weight loss or gain.  You have shortness of breath with chest pain.  You notice sudden or extreme swelling   of your face, hands, ankles, feet, or legs.  You have not felt your baby move in over an hour.  You have severe headaches that do not go away with medicine.  You have vision changes. Document Released: 01/19/2001 Document Revised: 01/30/2013 Document Reviewed: 03/28/2012 [employer] Patient Information 2015 [employer]. This information is not intended to replace advice given to you by your health care provider. Make sure you discuss any questions you have with your health care provider.  

## 2014-09-12 NOTE — MAU Provider Note (Signed)
History     CSN: [id number]  Arrival date and time: 09/12/14 2118   First Provider Initiated Contact with Patient 09/12/14 2206      No chief complaint on file.  HPI  Sue Cruz is a 24 y.o. G2P1 at [redacted]w[redacted]d who presents today because her hands have been hot. She denies any itching or hand pain. She is also concerned because she was recently in [place]. She states that she was in [place] for a family emergency from July 3-26th. She states that prior to going to the area she did ask, and was told that there were no current cases of Zika in the area where she was traveling. She only talked to her family members. She states that she asked her OB about having a test done, but was told that she did not need a test at that time. Her partner denies any symptoms of Zika (fever, rash, joint pain).   She denies any abdominal pain, contractions, vaginal bleeding or LOF. She states that the fetus has been active.   Past Medical History  Diagnosis Date  . Medical history non-contributory     Past Surgical History  Procedure Laterality Date  . No past surgeries      History reviewed. No pertinent family history.  History  Substance Use Topics  . Smoking status: Never Smoker   . Smokeless tobacco: Not on file  . Alcohol Use: No    Allergies: No Known Allergies  Prescriptions prior to admission  Medication Sig Dispense Refill Last Dose  . Prenatal Vit-Fe Fumarate-FA (PRENATAL MULTIVITAMIN) TABS tablet Take 1 tablet by mouth daily at 12 noon.   09/11/2014 at Unknown time  . acetaminophen (TYLENOL) 500 MG tablet Take 500 mg by mouth every 6 (six) hours as needed for headache.   More than a month at Unknown time  . promethazine (PHENERGAN) 25 MG tablet Take 1 tablet (25 mg total) by mouth every 6 (six) hours as needed for nausea or vomiting. (Patient not taking: Reported on 09/12/2014) 30 tablet 0 Not Taking at Unknown time    Review of Systems  Constitutional: Negative for fever.   Gastrointestinal: Negative for abdominal pain.  Musculoskeletal: Negative for joint pain.  Skin: Negative for rash.   Physical Exam   Blood pressure 113/63, pulse 86, temperature 98.2 F (36.8 C), temperature source Oral, resp. rate 18, height  (1.448 m), weight 75.297 kg (166 lb), last menstrual period 03/22/2014, SpO2 100 %.  Physical Exam  Nursing note and vitals reviewed. Constitutional: She is oriented to person, place, and time. She appears well-developed and well-nourished. No distress.  HENT:  Head: Normocephalic.  Cardiovascular: Normal rate.   Respiratory: Effort normal.  GI: Soft. There is no tenderness.  Neurological: She is alert and oriented to person, place, and time.  Skin: Skin is warm and dry. No erythema.  Psychiatric: She has a normal mood and affect.   FHT: 145, moderate with 10x10 accels, rare variable.  Toco: no UCs  MAU Course  Procedures  MDM PER CDC: Asymptomatic pregnant women with exposure to [place] may be offered screening with serologic testing within 2-12 weeks after the last date of possible exposure.  Assessment and Plan   1. Exposure to potentially harmful entity, initial encounter    DC home Comfort measures reviewed  3rd Trimester precautions  Patient offered Zika testing, that would need to be done after 09/17/14. Patient would like to have it done. Advised to FU with OBGYN for testing  at that time.  PTL precautions  Fetal kick counts RX: none  Return to MAU as needed FU with OB as planned  Follow-up Information    Follow up with Kathreen Cosier, MD.   Specialty:  Obstetrics and Gynecology   Why:  As scheduled   Contact information:   [address] [place] [address] [phone]         Tawnya Crook 09/12/2014, 10:30 PM

## 2014-09-12 NOTE — MAU Note (Signed)
Pt states for the last 3 weeks she has felt like her hands were always hot. Denies other complaints

## 2014-09-17 ENCOUNTER — Inpatient Hospital Stay (HOSPITAL_COMMUNITY)
Admission: AD | Admit: 2014-09-17 | Discharge: 2014-09-17 | Disposition: A | Discharge status: Home / Self Care | Payer: Medicaid Other | Source: Ambulatory Visit | Attending: Obstetrics | Admitting: Obstetrics

## 2014-09-17 ENCOUNTER — Encounter (HOSPITAL_COMMUNITY): Payer: Self-pay | Admitting: *Deleted

## 2014-09-17 DIAGNOSIS — Z3A26 26 weeks gestation of pregnancy: Secondary | ICD-10-CM | POA: Diagnosis not present

## 2014-09-17 DIAGNOSIS — O358XX1 Maternal care for other (suspected) fetal abnormality and damage, fetus 1: Secondary | ICD-10-CM

## 2014-09-17 DIAGNOSIS — O9989 Other specified diseases and conditions complicating pregnancy, childbirth and the puerperium: Secondary | ICD-10-CM | POA: Insufficient documentation

## 2014-09-17 DIAGNOSIS — Z20828 Contact with and (suspected) exposure to other viral communicable diseases: Secondary | ICD-10-CM | POA: Diagnosis present

## 2014-09-17 NOTE — Discharge Instructions (Signed)
See CDC handout

## 2014-09-17 NOTE — MAU Note (Signed)
PT SAYS HE WAS HERE  LAST  WEEK   AND  SHE  WENT  TO [place]  FROM    7-3  THRU 7-25      PT  DOES  NOT  THINK  SHE WAS  EXPOSED  TO ZIKA  VIRUS .   RETURN   TODAY  BECAUSE   PROVIDER  TOLD  HER  TO COME  BACK  .   DENIES  ANY S/S.

## 2014-09-17 NOTE — MAU Provider Note (Signed)
Chief Complaint:  No chief complaint on file.   First Provider Initiated Contact with Patient 09/17/14 2009      HPI: Sue Cruz is a 24 y.o. G2P1 at [redacted]w[redacted]d who presents to maternity admissions requesting Zika testing. Traveled to [place] 08/11/14-09/02/14. No fever, rash or known bites. Partner w/out Sx. Was seen in MAU 09/12/14 for same. Per CDC pt was instructed to F/U w/ Ob provider for test two weeks after last exposure. Came to MAU instead.   Denies contractions, leakage of fluid or vaginal bleeding. Good fetal movement.   Past Medical History: Past Medical History  Diagnosis Date  . Medical history non-contributory     Past obstetric history: OB History  Gravida Para Term Preterm AB SAB TAB Ectopic Multiple Living  # Outcome Date GA Lbr Len/2nd Weight Sex Delivery Anes PTL Lv  2 Current           1 Para      Vag-Spont   Y      Past Surgical History: Past Surgical History  Procedure Laterality Date  . No past surgeries       Family History: No family history on file.  Social History: History  Substance Use Topics  . Smoking status: Never Smoker   . Smokeless tobacco: Not on file  . Alcohol Use: No    Allergies: No Known Allergies  Meds:  Prescriptions prior to admission  Medication Sig Dispense Refill Last Dose  . acetaminophen (TYLENOL) 500 MG tablet Take 500 mg by mouth every 6 (six) hours as needed for headache.   More than a month at Unknown time  . Prenatal Vit-Fe Fumarate-FA (PRENATAL MULTIVITAMIN) TABS tablet Take 1 tablet by mouth daily at 12 noon.   09/11/2014 at Unknown time    I have reviewed patient's Past Medical Hx, Surgical Hx, Family Hx, Social Hx, medications and allergies.   ROS:  Review of Systems  Constitutional: Negative for fever and chills.  Genitourinary: Negative for vaginal bleeding.       Neg for LOF  Skin: Negative for rash.    Physical Exam  Patient Vitals for the past 24 hrs:  BP Temp Temp src Pulse Resp  Height Weight  09/17/14 1944 109/66 mmHg 98.1 F (36.7 C) Oral 85 20  (1.499 m) 166 lb 8 oz (75.524 kg)   Constitutional: Well-developed, well-nourished female in no acute distress.  Cardiovascular: normal rate Respiratory: normal effort GI: Gravid appropriate for gestational age.  Neurologic: Alert and oriented x 4.   FHR 152 by doppler   Labs: No results found for this or any previous visit (from the past 24 hour(s)).  Imaging:  No results found.  MAU Course: Discussed w/ Mindi Junker [facility]). Referred pt to [facility] Communicable Disease Specialist [phone] for testing.    MDM:  Assessment: 1. 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  Preterm labor precautions and fetal kick counts   Follow-up Information    Call [facility] Communiciable Disease Specialist.   Contact information:   [phone]      Follow up with Kathreen Cosier, MD.   Specialty:  Obstetrics and Gynecology   Why:  Routine prenatal visit   Contact information:   [address] [place] [address] [phone]          Medication List  ASK your doctor about these medications        acetaminophen 500 MG tablet  Commonly known as:  TYLENOL  Take 500 mg by mouth every 6 (six) hours as needed for headache.     prenatal multivitamin Tabs tablet  Take 1 tablet by mouth daily at 12 noon.       Dorathy Kinsman, CNM 09/17/2014 8:00 PM

## 2014-09-17 NOTE — MAU Note (Signed)
CHANGED  EDC-  PT  SAYS  DR  MARSHALL CHANGED  AFTER  U/S

## 2014-10-15 LAB — PROCEDURE REPORT - SCANNED: Pap: NEGATIVE

## 2014-11-25 LAB — OB RESULTS CONSOLE GBS: STREP GROUP B AG: NEGATIVE

## 2014-12-06 ENCOUNTER — Encounter (HOSPITAL_COMMUNITY): Payer: Self-pay | Admitting: *Deleted

## 2014-12-06 ENCOUNTER — Inpatient Hospital Stay (HOSPITAL_COMMUNITY)
Admission: AD | Admit: 2014-12-06 | Discharge: 2014-12-06 | Disposition: A | Discharge status: Home / Self Care | Payer: Medicaid Other | Source: Ambulatory Visit | Attending: Obstetrics | Admitting: Obstetrics

## 2014-12-06 DIAGNOSIS — Z3493 Encounter for supervision of normal pregnancy, unspecified, third trimester: Secondary | ICD-10-CM | POA: Diagnosis present

## 2014-12-06 NOTE — MAU Note (Signed)
Pt having lower abd pain - ? uc's since last night around 2330.  Pain is now more intense since 1230.  Denies bleeding or LOF.

## 2014-12-30 ENCOUNTER — Inpatient Hospital Stay (HOSPITAL_COMMUNITY)
Admission: AD | Admit: 2014-12-30 | Discharge: 2015-01-01 | DRG: 775 | Disposition: A | Discharge status: Home / Self Care | Payer: Medicaid Other | Source: Ambulatory Visit | Attending: Obstetrics | Admitting: Obstetrics

## 2014-12-30 ENCOUNTER — Other Ambulatory Visit: Payer: Self-pay | Admitting: Obstetrics

## 2014-12-30 ENCOUNTER — Encounter (HOSPITAL_COMMUNITY): Payer: Self-pay

## 2014-12-30 DIAGNOSIS — Z3A41 41 weeks gestation of pregnancy: Secondary | ICD-10-CM | POA: Diagnosis not present

## 2014-12-30 DIAGNOSIS — O480 Post-term pregnancy: Secondary | ICD-10-CM | POA: Diagnosis present

## 2014-12-30 DIAGNOSIS — O26899 Other specified pregnancy related conditions, unspecified trimester: Secondary | ICD-10-CM

## 2014-12-30 DIAGNOSIS — R109 Unspecified abdominal pain: Secondary | ICD-10-CM

## 2014-12-30 LAB — TYPE AND SCREEN
ABO/RH(D): O POS
Antibody Screen: NEGATIVE

## 2014-12-30 LAB — CBC
HCT: 34.6 % — ABNORMAL LOW (ref 36.0–46.0)
Hemoglobin: 11.3 g/dL — ABNORMAL LOW (ref 12.0–15.0)
MCH: 26.6 pg (ref 26.0–34.0)
MCHC: 32.7 g/dL (ref 30.0–36.0)
MCV: 81.4 fL (ref 78.0–100.0)
PLATELETS: 267 10*3/uL (ref 150–400)
RBC: 4.25 MIL/uL (ref 3.87–5.11)
RDW: 15.4 % (ref 11.5–15.5)
WBC: 9 10*3/uL (ref 4.0–10.5)

## 2014-12-30 MED ORDER — TETANUS-DIPHTH-ACELL PERTUSSIS 5-2.5-18.5 LF-MCG/0.5 IM SUSP
0.5000 mL | Freq: Once | INTRAMUSCULAR | Status: AC
  Administered 2015-01-01: 0.5 mL via INTRAMUSCULAR
  Filled 2014-12-30: qty 0.5

## 2014-12-30 MED ORDER — OXYTOCIN 40 UNITS IN LACTATED RINGERS INFUSION - SIMPLE MED
62.5000 mL/h | INTRAVENOUS | Status: DC | PRN
  Administered 2014-12-30: 62.5 mL/h via INTRAVENOUS

## 2014-12-30 MED ORDER — FLEET ENEMA 7-19 GM/118ML RE ENEM: 1.0000 | ENEMA | RECTAL | Status: DC | PRN

## 2014-12-30 MED ORDER — LIDOCAINE HCL (PF) 1 % IJ SOLN
30.0000 mL | INTRAMUSCULAR | Status: DC | PRN
  Filled 2014-12-30: qty 30

## 2014-12-30 MED ORDER — BENZOCAINE-MENTHOL 20-0.5 % EX AERO: 1.0000 "application " | INHALATION_SPRAY | CUTANEOUS | Status: DC | PRN

## 2014-12-30 MED ORDER — PRENATAL MULTIVITAMIN CH
1.0000 | ORAL_TABLET | Freq: Every day | ORAL | Status: DC
  Administered 2014-12-31 – 2015-01-01 (×2): 1 via ORAL
  Filled 2014-12-30 (×2): qty 1

## 2014-12-30 MED ORDER — SIMETHICONE 80 MG PO CHEW: 80.0000 mg | CHEWABLE_TABLET | ORAL | Status: DC | PRN

## 2014-12-30 MED ORDER — FENTANYL CITRATE (PF) 100 MCG/2ML IJ SOLN
50.0000 ug | INTRAMUSCULAR | Status: DC | PRN
  Administered 2014-12-30 (×2): 100 ug via INTRAVENOUS
  Filled 2014-12-30 (×2): qty 2

## 2014-12-30 MED ORDER — OXYTOCIN 40 UNITS IN LACTATED RINGERS INFUSION - SIMPLE MED: 62.5000 mL/h | INTRAVENOUS | Status: DC

## 2014-12-30 MED ORDER — DOCUSATE SODIUM 100 MG PO CAPS
100.0000 mg | ORAL_CAPSULE | Freq: Two times a day (BID) | ORAL | Status: DC
  Administered 2014-12-31 – 2015-01-01 (×2): 100 mg via ORAL
  Filled 2014-12-30 (×3): qty 1

## 2014-12-30 MED ORDER — LANOLIN HYDROUS EX OINT: TOPICAL_OINTMENT | CUTANEOUS | Status: DC | PRN

## 2014-12-30 MED ORDER — DIPHENHYDRAMINE HCL 25 MG PO CAPS: 25.0000 mg | ORAL_CAPSULE | Freq: Four times a day (QID) | ORAL | Status: DC | PRN

## 2014-12-30 MED ORDER — DIBUCAINE 1 % RE OINT: 1.0000 "application " | TOPICAL_OINTMENT | RECTAL | Status: DC | PRN

## 2014-12-30 MED ORDER — OXYCODONE-ACETAMINOPHEN 5-325 MG PO TABS
1.0000 | ORAL_TABLET | ORAL | Status: DC | PRN
  Administered 2014-12-31 – 2015-01-01 (×2): 1 via ORAL
  Filled 2014-12-30 (×2): qty 1

## 2014-12-30 MED ORDER — TERBUTALINE SULFATE 1 MG/ML IJ SOLN
0.2500 mg | Freq: Once | INTRAMUSCULAR | Status: DC | PRN
  Filled 2014-12-30: qty 1

## 2014-12-30 MED ORDER — IBUPROFEN 600 MG PO TABS
600.0000 mg | ORAL_TABLET | Freq: Four times a day (QID) | ORAL | Status: DC
  Administered 2014-12-30 – 2015-01-01 (×7): 600 mg via ORAL
  Filled 2014-12-30 (×7): qty 1

## 2014-12-30 MED ORDER — TERBUTALINE SULFATE 1 MG/ML IJ SOLN: 0.2500 mg | Freq: Once | INTRAMUSCULAR | Status: DC | PRN

## 2014-12-30 MED ORDER — ZOLPIDEM TARTRATE 5 MG PO TABS: 5.0000 mg | ORAL_TABLET | Freq: Every evening | ORAL | Status: DC | PRN

## 2014-12-30 MED ORDER — OXYTOCIN BOLUS FROM INFUSION: 500.0000 mL | INTRAVENOUS | Status: DC

## 2014-12-30 MED ORDER — LACTATED RINGERS IV SOLN
INTRAVENOUS | Status: DC
  Administered 2014-12-30 (×2): via INTRAVENOUS

## 2014-12-30 MED ORDER — WITCH HAZEL-GLYCERIN EX PADS: 1.0000 "application " | MEDICATED_PAD | CUTANEOUS | Status: DC | PRN

## 2014-12-30 MED ORDER — ONDANSETRON HCL 4 MG PO TABS: 4.0000 mg | ORAL_TABLET | ORAL | Status: DC | PRN

## 2014-12-30 MED ORDER — OXYTOCIN 40 UNITS IN LACTATED RINGERS INFUSION - SIMPLE MED
1.0000 m[IU]/min | INTRAVENOUS | Status: DC
  Administered 2014-12-30: 2 m[IU]/min via INTRAVENOUS
  Filled 2014-12-30: qty 1000

## 2014-12-30 MED ORDER — ACETAMINOPHEN 325 MG PO TABS: 650.0000 mg | ORAL_TABLET | ORAL | Status: DC | PRN

## 2014-12-30 MED ORDER — INFLUENZA VAC SPLIT QUAD 0.5 ML IM SUSY
0.5000 mL | PREFILLED_SYRINGE | INTRAMUSCULAR | Status: DC
  Filled 2014-12-30: qty 0.5

## 2014-12-30 MED ORDER — ONDANSETRON HCL 4 MG/2ML IJ SOLN: 4.0000 mg | INTRAMUSCULAR | Status: DC | PRN

## 2014-12-30 MED ORDER — ONDANSETRON HCL 4 MG/2ML IJ SOLN: 4.0000 mg | Freq: Four times a day (QID) | INTRAMUSCULAR | Status: DC | PRN

## 2014-12-30 MED ORDER — EPHEDRINE 5 MG/ML INJ
10.0000 mg | INTRAVENOUS | Status: DC | PRN
  Filled 2014-12-30: qty 2

## 2014-12-30 MED ORDER — FENTANYL 2.5 MCG/ML BUPIVACAINE 1/10 % EPIDURAL INFUSION (WH - ANES): 14.0000 mL/h | INTRAMUSCULAR | Status: DC | PRN

## 2014-12-30 MED ORDER — SENNOSIDES-DOCUSATE SODIUM 8.6-50 MG PO TABS
2.0000 | ORAL_TABLET | ORAL | Status: DC
  Administered 2015-01-01: 2 via ORAL
  Filled 2014-12-30: qty 2

## 2014-12-30 MED ORDER — OXYCODONE-ACETAMINOPHEN 5-325 MG PO TABS
2.0000 | ORAL_TABLET | ORAL | Status: DC | PRN
  Administered 2014-12-31: 2 via ORAL
  Filled 2014-12-30: qty 2

## 2014-12-30 MED ORDER — CITRIC ACID-SODIUM CITRATE 334-500 MG/5ML PO SOLN: 30.0000 mL | ORAL | Status: DC | PRN

## 2014-12-30 MED ORDER — DIPHENHYDRAMINE HCL 50 MG/ML IJ SOLN: 12.5000 mg | INTRAMUSCULAR | Status: DC | PRN

## 2014-12-30 MED ORDER — LACTATED RINGERS IV SOLN: 500.0000 mL | INTRAVENOUS | Status: DC | PRN

## 2014-12-30 MED ORDER — PHENYLEPHRINE 40 MCG/ML (10ML) SYRINGE FOR IV PUSH (FOR BLOOD PRESSURE SUPPORT)
80.0000 ug | PREFILLED_SYRINGE | INTRAVENOUS | Status: DC | PRN
  Filled 2014-12-30: qty 2

## 2014-12-30 NOTE — H&P (Signed)
This is Dr. Francoise CeoBernard Bahja Bence dictating the history and physical on  Sue Cruz   she is a 24 year old gravida 2 para 101 EDC 12/22/2014 she's 41 weeks brought in for induction negative GBS her cervix is 2 cm 70% vertex -2 amniotomy performed the fluids clear and she is on low-dose Pitocin Past medical history negative Past surgical history negative Social history negative System review negative Physical exam well-developed female in labor HEENT negative Lungs clear to P&A Heart regular rhythm no murmurs no gallops Breasts negative Abdomen term Pelvic as described above Extremities negative

## 2014-12-30 NOTE — Progress Notes (Signed)
Sue Cruz is a 24 y.o. G2P1 at 184w1d by LMP admitted for active labor  Subjective:   Objective: BP 138/89 mmHg  Pulse 73  Temp(Src) 98.6 F (37 C) (Axillary)  Resp 18  Ht 4\' 9"  (1.448 m)  Wt 189 lb (85.73 kg)  BMI 40.89 kg/m2  LMP 03/22/2014      FHT:  FHR: 140 bpm, variability: moderate,  accelerations:  Present,  decelerations:  Absent UC:   regular, every 2-3 minutes SVE:   Dilation: 5 Effacement (%): 90 Station: -2 Exam by:: Ace GinsL. Cresenzo, RN  Labs: Lab Results  Component Value Date   WBC 9.0 12/30/2014   HGB 11.3* 12/30/2014   HCT 34.6* 12/30/2014   MCV 81.4 12/30/2014   PLT 267 12/30/2014    Assessment / Plan: Spontaneous labor, progressing normally  Labor: Progressing normally Preeclampsia:  n/a Fetal Wellbeing:  Category I Pain Control:  Stadol I/D:  n/a Anticipated MOD:  NSVD  Kris Burd A 12/30/2014, 9:47 PM

## 2014-12-30 NOTE — Progress Notes (Signed)
Patient tachysystolic.  DR harper made aware. Order to turn off Pitocin. Restart Pitocin per previous order if patient does not continue to progress  Patient made aware. Will continue to monitor.

## 2014-12-31 LAB — CBC
HEMATOCRIT: 30.9 % — AB (ref 36.0–46.0)
Hemoglobin: 10.4 g/dL — ABNORMAL LOW (ref 12.0–15.0)
MCH: 27.2 pg (ref 26.0–34.0)
MCHC: 33.7 g/dL (ref 30.0–36.0)
MCV: 80.7 fL (ref 78.0–100.0)
Platelets: 258 10*3/uL (ref 150–400)
RBC: 3.83 MIL/uL — AB (ref 3.87–5.11)
RDW: 15.5 % (ref 11.5–15.5)
WBC: 12.7 10*3/uL — AB (ref 4.0–10.5)

## 2014-12-31 LAB — RPR: RPR: NONREACTIVE

## 2014-12-31 MED ORDER — INFLUENZA VAC SPLIT QUAD 0.5 ML IM SUSY
0.5000 mL | PREFILLED_SYRINGE | INTRAMUSCULAR | Status: AC
  Administered 2015-01-01: 0.5 mL via INTRAMUSCULAR
  Filled 2014-12-31: qty 0.5

## 2014-12-31 NOTE — Progress Notes (Signed)
Patient ID: Sue SiasJoanna M Schuessler, female   DOB: [birth year], 24 y.o.   MRN: [id number] Postpartum day one Blood pressure 02/24/1962 respiration 18 afebrile Fundus firm Lochia moderate Legs negative doing well

## 2014-12-31 NOTE — Lactation Note (Signed)
This note was copied from the chart of Sue Cruz. Lactation Consultation Note  Patient Name: Sue Cruz's Date: 12/31/2014 Reason for consult: Initial assessment    With this mom and term baby, now 6914 hours old. Mom has partially inverted nipples, and was not able to latch her first baby. She tried to latch this baby, but said the baby would not maintain latch. She has fed formula twice by bottle .  On exam, mom has easily expressed colostrum. i set up a DEP, showed mom how to set for initiation setting, and advised her to follow with hand expression.  Mom is active with WIC, so fax sent incase mom needs a DEP.  Baby asleep in dad arms. I examined her mouth some - she has an upper lip frenulum that extends to the gum line, a high palate, and what may be a posterior tight frenulum. The baby was too sleepy to evaluate suck.  Mom will call for help with latching, when baby shows hunger cues.   Maternal Data Formula Feeding for Exclusion: Yes Reason for exclusion: Mother's choice to formula and breast feed on admission Has patient been taught Hand Expression?: Yes Does the patient have breastfeeding experience prior to this delivery?: Yes  Feeding    LATCH Score/Interventions    Audible Swallowing:  (easily expressed colostrum)  Type of Nipple: Inverted (nipples ar mostly everted with an inverted middle) Intervention(s): Shells              Lactation Tools Discussed/Used WIC Program: Yes (fax sent for mom to get DEP)   Consult Status Consult Status: Follow-up Date: 01/01/15 Follow-up type: In-patient    Alfred LevinsLee, Sani Madariaga Anne 12/31/2014, 12:14 PM

## 2014-12-31 NOTE — Progress Notes (Signed)
SCD hose applied- pt refused @ this time. Turned off- per pt request.

## 2015-01-01 NOTE — Progress Notes (Signed)
Patient ID: Sue SiasJoanna M Sonnenberg, female   DOB: 12-30-1990, 24 y.o.   MRN: [id number] Postpartum day 2 Blood pressure 108/65 pulse 76 respiration 18 afebrile Fundus firm Lochia moderate Legs negative Doing well home today

## 2015-01-01 NOTE — Discharge Summary (Signed)
Obstetric Discharge Summary Reason for Admission: induction of labor Prenatal Procedures: none Intrapartum Procedures: spontaneous vaginal delivery Postpartum Procedures: none Complications-Operative and Postpartum: none HEMOGLOBIN  Date Value Ref Range Status  12/31/2014 10.4* 12.0 - 15.0 g/dL Final   HCT  Date Value Ref Range Status  12/31/2014 30.9* 36.0 - 46.0 % Final    Physical Exam:  General: alert Lochia: appropriate Uterine Fundus: firm Incision: healing well DVT Evaluation: No evidence of DVT seen on physical exam.  Discharge Diagnoses: Term Pregnancy-delivered  Discharge Information: Date: 01/01/2015 Activity: pelvic rest Diet: routine Medications: Percocet Condition: improved Instructions: refer to practice specific booklet Discharge to: home Follow-up Information    Follow up with Kathreen CosierMARSHALL,BERNARD A, MD.   Specialty:  Obstetrics and Gynecology   Contact information:   [address] [place] [id number] [phone]       Newborn Data: Live born female  Birth Weight: 8 lb 13.6 oz (4015 g) APGAR: 9, 9  Home with mother.  MARSHALL,BERNARD A 01/01/2015, 6:31 AM

## 2015-01-01 NOTE — Discharge Instructions (Signed)
Discharge instructions   You can wash your hair  Shower  Eat what you want  Drink what you want  See me in 6 weeks  Your ankles are going to swell more in the next 2 weeks than when pregnant  No sex for 6 weeks   Shantrell Placzek A, MD 01/01/2015

## 2015-01-01 NOTE — Lactation Note (Signed)
This note was copied from the chart of Girl Orpah MelterJoanna Dedominicis. Lactation Consultation Note  Patient Name: Girl Orpah MelterJoanna Spradlin ZOXWR'UToday's Date: 01/01/2015 Reason for consult: Follow-up assessment;Difficult latch   Follow up with mom prior to D/C. Mom plans to express EBM and to feed infant EBM/Formula. Mom using DEBP in hospital and is receiving small amounts of colostrum that she has fed to infant. She voiced shw knows how to hand express. Mom has manual pump to take home. She declined DEBP today. [facility] Mom has LC brochure, reviewed phone #, OP Services, Support Groups, and BF Resources. Enc mom to call with questions/concerns.   Maternal Data Formula Feeding for Exclusion: No Has patient been taught Hand Expression?: Yes  Feeding Feeding Type: Formula Nipple Type: Slow - flow  LATCH Score/Interventions                      Lactation Tools Discussed/Used WIC Program: Yes   Consult Status Consult Status: Complete Follow-up type: Call as needed    Ed BlalockSharon S Hice 01/01/2015, 9:50 AM

## 2015-05-26 ENCOUNTER — Ambulatory Visit: Payer: Medicaid Other | Admitting: Sports Medicine

## 2015-06-16 ENCOUNTER — Ambulatory Visit (INDEPENDENT_AMBULATORY_CARE_PROVIDER_SITE_OTHER): Payer: Medicaid Other | Admitting: Sports Medicine

## 2015-06-16 ENCOUNTER — Encounter: Payer: Self-pay | Admitting: Sports Medicine

## 2015-06-16 DIAGNOSIS — L603 Nail dystrophy: Secondary | ICD-10-CM | POA: Diagnosis not present

## 2015-06-16 DIAGNOSIS — M79675 Pain in left toe(s): Secondary | ICD-10-CM | POA: Diagnosis not present

## 2015-06-16 NOTE — Progress Notes (Signed)
Patient ID: Sue Cruz, female   DOB: 1990-08-10, 25 y.o.   MRN: [id number] Subjective: Sue SiasJoanna M Cruz is a 25 y.o. female patient seen today in office with complaint of painful thickened and discolored Left big toe nail x 1 year; reports that her niece stepped on her toe and the nail fell off and has been growing this way since. Patient is desiring treatment for nail changes; has tried OTC topicals in the past with no improvement. Reports that nails are becoming difficult to manage because of the thickness and appearance. Patient has no other pedal complaints at this time.   Patient Active Problem List   Diagnosis Date Noted  . Normal labor and delivery 12/30/2014  . NSVD (normal spontaneous vaginal delivery) 12/30/2014  . Abdominal pain affecting pregnancy 05/03/2014  . Hives 07/15/2011    Current Outpatient Prescriptions on File Prior to Visit  Medication Sig Dispense Refill  . [DISCONTINUED] Etonogestrel (NEXPLANON [place]) Inject 1 application into the skin once. Implanted February 2013     No current facility-administered medications on file prior to visit.    No Known Allergies  Objective: Physical Exam  General: Well developed, nourished, no acute distress, awake, alert and oriented x 3  Vascular: Dorsalis pedis artery 2/4 bilateral, Posterior tibial artery 2/4 bilateral, skin temperature warm to warm proximal to distal bilateral lower extremities, no varicosities, pedal hair present bilateral.  Neurological: Gross sensation present via light touch bilateral.   Dermatological: Skin is warm, dry, and supple bilateral, Nails 1-10 are tender, short thick, and discolored with mild subungal debris with the left hallux nail most involved with a small trauma line present, no webspace macerations present bilateral, no open lesions present bilateral, no callus/corns/hyperkeratotic tissue present bilateral. No signs of infection bilateral.  Musculoskeletal: No boney deformities noted  bilateral. Muscular strength within normal limits without painon range of motion. No pain with calf compression bilateral.  Assessment and Plan:  Problem List Items Addressed This Visit    None    Visit Diagnoses    Nail dystrophy    -  Primary    Left hallux nail    Toe pain, left          -Examined patient -Discussed treatment options for painful dystrophic nails  -Fungal culture was obtained from Left hallux nail [facility] -Patient to return in 4 weeks for follow up evaluation and discussion of fungal culture results or sooner if symptoms worsen.  Asencion Islamitorya Calene Paradiso, DPM

## 2015-07-21 ENCOUNTER — Ambulatory Visit (INDEPENDENT_AMBULATORY_CARE_PROVIDER_SITE_OTHER): Payer: Medicaid Other | Admitting: Sports Medicine

## 2015-07-21 ENCOUNTER — Encounter: Payer: Self-pay | Admitting: Sports Medicine

## 2015-07-21 DIAGNOSIS — M79675 Pain in left toe(s): Secondary | ICD-10-CM

## 2015-07-21 DIAGNOSIS — B351 Tinea unguium: Secondary | ICD-10-CM | POA: Diagnosis not present

## 2015-07-21 MED ORDER — CICLOPIROX 8 % EX SOLN: Freq: Every day | CUTANEOUS | Status: DC

## 2015-07-21 NOTE — Progress Notes (Signed)
Patient ID: Tedd SiasJoanna M Violante, female   DOB: 1990-02-25, 25 y.o.   MRN: [id number] Subjective: Tedd SiasJoanna M Emmerich is a 25 y.o. female patient seen today in office for fungal culture results. Patient has no other pedal complaints at this time.   Patient Active Problem List   Diagnosis Date Noted  . Normal labor and delivery 12/30/2014  . NSVD (normal spontaneous vaginal delivery) 12/30/2014  . Abdominal pain affecting pregnancy 05/03/2014  . Hives 07/15/2011    Current Outpatient Prescriptions on File Prior to Visit  Medication Sig Dispense Refill  . [DISCONTINUED] Etonogestrel (NEXPLANON [place]) Inject 1 application into the skin once. Implanted February 2013     No current facility-administered medications on file prior to visit.    No Known Allergies  Objective: Physical Exam  General: Well developed, nourished, no acute distress, awake, alert and oriented x 3  Vascular: Dorsalis pedis artery 2/4 bilateral, Posterior tibial artery 2/4 bilateral, skin temperature warm to warm proximal to distal bilateral lower extremities, no varicosities, pedal hair present bilateral.  Neurological: Gross sensation present via light touch bilateral.   Dermatological: Skin is warm, dry, and supple bilateral, Nails 1-10 are tender, short thick, and discolored with mild subungal debris with the left hallux most involved, no webspace macerations present bilateral, no open lesions present bilateral, no callus/corns/hyperkeratotic tissue present bilateral. No signs of infection bilateral.  Musculoskeletal: No boney deformities noted bilateral. Muscular strength within normal limits without painon range of motion. No pain with calf compression bilateral.  Fungal culture + T. Ruburm, S. Brevicalulis  Assessment and Plan:  Problem List Items Addressed This Visit    None    Visit Diagnoses    Toe pain, left    -  Primary    Relevant Medications    ciclopirox (PENLAC) 8 % solution    Other Relevant Orders    Hepatic Function Panel    Dermatophytosis of nail        Fungal Culture + T. Ruburm, S. Brevicalulis    Relevant Medications    ciclopirox (PENLAC) 8 % solution    Other Relevant Orders    Hepatic Function Panel      -Examined patient -Discussed treatment options for painful mycotic nails especially at Left hallux  -Patient opt for oral Lamisil with full understanding of medication risks; ordered LFTs for review if within normal limits will proceed with sending Rx to pharmacy for lamisil 250mg  PO daily. Anticipate 12 week course.  -Patient opt for topical as well; Rx Penlac to start once obtained from pharmacy  -Advised good hygiene habits -Patient to return in 6 weeks for follow up evaluation or sooner if symptoms worsen.  Asencion Islamitorya Symia Herdt, DPM

## 2015-07-23 ENCOUNTER — Telehealth: Payer: Self-pay | Admitting: Sports Medicine

## 2015-07-23 DIAGNOSIS — B351 Tinea unguium: Secondary | ICD-10-CM

## 2015-07-23 LAB — HEPATIC FUNCTION PANEL
ALBUMIN: 4.5 g/dL (ref 3.6–5.1)
ALT: 13 U/L (ref 6–29)
AST: 16 U/L (ref 10–30)
Alkaline Phosphatase: 74 U/L (ref 33–115)
BILIRUBIN DIRECT: 0.2 mg/dL (ref <=0.2)
BILIRUBIN TOTAL: 0.9 mg/dL (ref 0.2–1.2)
Indirect Bilirubin: 0.7 mg/dL (ref 0.2–1.2)
Total Protein: 7.3 g/dL (ref 6.1–8.1)

## 2015-07-23 MED ORDER — TERBINAFINE HCL 250 MG PO TABS: 250.0000 mg | ORAL_TABLET | Freq: Every day | ORAL | Status: DC

## 2015-07-23 NOTE — Telephone Encounter (Signed)
Labs within normal limits. Sent to pharmacy Lamisil 250 mg PO daily. Patient to follow up in 6 weeks as scheduled. Left voicemail informing patient that medication has been sent to [facility] for pick up. -Dr. Marylene LandStover

## 2015-08-11 ENCOUNTER — Encounter: Payer: Self-pay | Admitting: Sports Medicine

## 2015-09-01 ENCOUNTER — Ambulatory Visit (INDEPENDENT_AMBULATORY_CARE_PROVIDER_SITE_OTHER): Payer: Medicaid Other | Admitting: Sports Medicine

## 2015-09-01 ENCOUNTER — Encounter: Payer: Self-pay | Admitting: Sports Medicine

## 2015-09-01 DIAGNOSIS — M79675 Pain in left toe(s): Secondary | ICD-10-CM | POA: Diagnosis not present

## 2015-09-01 DIAGNOSIS — B351 Tinea unguium: Secondary | ICD-10-CM

## 2015-09-01 NOTE — Progress Notes (Signed)
Patient ID: NYEISHA DENZLER, female   DOB: [birth year], 25 y.o.   MRN: [id number] Subjective: FIAMMA DELAFIELD is a 25 y.o. female patient seen today in office for follow up evaluation after starting Penlac and PO Lamisil approximately 4 weeks ago. Patient denies any issues. Patient has no other pedal complaints at this time.   Patient Active Problem List   Diagnosis Date Noted  . Normal labor and delivery 12/30/2014  . NSVD (normal spontaneous vaginal delivery) 12/30/2014  . Abdominal pain affecting pregnancy 05/03/2014  . Hives 07/15/2011    Current Outpatient Prescriptions on File Prior to Visit  Medication Sig Dispense Refill  . ciclopirox (PENLAC) 8 % solution Apply topically at bedtime. Apply over nail and surrounding skin. Apply daily over previous coat. After using a Nail file & continue cycle. 6.6 mL 8  . terbinafine (LAMISIL) 250 MG tablet Take 1 tablet (250 mg total) by mouth daily. 30 tablet 2  . [DISCONTINUED] Etonogestrel (NEXPLANON [place]) Inject 1 application into the skin once. Implanted February 2013     No current facility-administered medications on file prior to visit.     No Known Allergies  Objective: Physical Exam  General: Well developed, nourished, no acute distress, awake, alert and oriented x 3  Vascular: Dorsalis pedis artery 2/4 bilateral, Posterior tibial artery 2/4 bilateral, skin temperature warm to warm proximal to distal bilateral lower extremities, no varicosities, pedal hair present bilateral.  Neurological: Gross sensation present via light touch bilateral.   Dermatological: Skin is warm, dry, and supple bilateral, Nails 1-10 are tender, short thick, and discolored with mild subungal debris with the left hallux most involved, no webspace macerations present bilateral, no open lesions present bilateral, no callus/corns/hyperkeratotic tissue present bilateral. No signs of infection bilateral.  Musculoskeletal: No boney deformities noted bilateral. Muscular  strength within normal limits without painon range of motion. No pain with calf compression bilateral.  Fungal culture + T. Ruburm, S. Brevicalulis  Assessment and Plan:  Problem List Items Addressed This Visit    None    Visit Diagnoses    Nail fungus    -  Primary   Relevant Orders   Hepatic Function Panel   Toe pain, left       Relevant Orders   Hepatic Function Panel     -Examined patient -Rx Hepatic panel; will call if abnormal -Cont Lamisil as Rx -Cont Penlac -Advised good hygiene habits -Patient to return in 6 weeks for follow up evaluation or sooner if symptoms worsen.  Asencion Islam, DPM

## 2015-09-01 NOTE — Patient Instructions (Addendum)
Continue with Lamisil pills. Do not mix with alcohol Get Blood work completed Continue with Penlac nail polish/laqure  See Dr. Marylene Land in 6 weeks for re-check

## 2015-10-14 ENCOUNTER — Encounter: Payer: Medicaid Other | Admitting: Sports Medicine

## 2015-10-28 ENCOUNTER — Encounter: Payer: Medicaid Other | Admitting: Sports Medicine

## 2015-11-04 ENCOUNTER — Encounter: Payer: Self-pay | Admitting: *Deleted

## 2016-04-19 ENCOUNTER — Encounter: Payer: Medicaid Other | Admitting: Gynecology

## 2016-05-17 ENCOUNTER — Encounter: Payer: Medicaid Other | Admitting: Gynecology

## 2016-05-17 DIAGNOSIS — Z0289 Encounter for other administrative examinations: Secondary | ICD-10-CM

## 2016-06-23 ENCOUNTER — Encounter: Payer: Self-pay | Admitting: Gynecology

## 2016-07-07 ENCOUNTER — Ambulatory Visit (INDEPENDENT_AMBULATORY_CARE_PROVIDER_SITE_OTHER): Payer: Medicaid Other | Admitting: Gynecology

## 2016-07-07 ENCOUNTER — Encounter: Payer: Self-pay | Admitting: Gynecology

## 2016-07-07 ENCOUNTER — Telehealth: Payer: Self-pay | Admitting: *Deleted

## 2016-07-07 VITALS — BP 100/64 | Ht 61.25 in | Wt 143.6 lb

## 2016-07-07 DIAGNOSIS — R102 Pelvic and perineal pain: Secondary | ICD-10-CM

## 2016-07-07 DIAGNOSIS — Z8742 Personal history of other diseases of the female genital tract: Secondary | ICD-10-CM | POA: Insufficient documentation

## 2016-07-07 DIAGNOSIS — Z01419 Encounter for gynecological examination (general) (routine) without abnormal findings: Secondary | ICD-10-CM | POA: Diagnosis not present

## 2016-07-07 DIAGNOSIS — Z0000 Encounter for general adult medical examination without abnormal findings: Secondary | ICD-10-CM

## 2016-07-07 DIAGNOSIS — N898 Other specified noninflammatory disorders of vagina: Secondary | ICD-10-CM

## 2016-07-07 DIAGNOSIS — B373 Candidiasis of vulva and vagina: Secondary | ICD-10-CM

## 2016-07-07 DIAGNOSIS — L292 Pruritus vulvae: Secondary | ICD-10-CM

## 2016-07-07 DIAGNOSIS — B3731 Acute candidiasis of vulva and vagina: Secondary | ICD-10-CM

## 2016-07-07 DIAGNOSIS — R5383 Other fatigue: Secondary | ICD-10-CM

## 2016-07-07 LAB — CBC WITH DIFFERENTIAL/PLATELET
BASOS PCT: 0 %
Basophils Absolute: 0 cells/uL (ref 0–200)
EOS ABS: 164 {cells}/uL (ref 15–500)
EOS PCT: 2 %
HCT: 41 % (ref 35.0–45.0)
Hemoglobin: 13.5 g/dL (ref 11.7–15.5)
Lymphocytes Relative: 31 %
Lymphs Abs: 2542 cells/uL (ref 850–3900)
MCH: 27.2 pg (ref 27.0–33.0)
MCHC: 32.9 g/dL (ref 32.0–36.0)
MCV: 82.5 fL (ref 80.0–100.0)
MONOS PCT: 5 %
MPV: 10.1 fL (ref 7.5–12.5)
Monocytes Absolute: 410 cells/uL (ref 200–950)
Neutro Abs: 5084 cells/uL (ref 1500–7800)
Neutrophils Relative %: 62 %
PLATELETS: 319 10*3/uL (ref 140–400)
RBC: 4.97 MIL/uL (ref 3.80–5.10)
RDW: 13.7 % (ref 11.0–15.0)
WBC: 8.2 10*3/uL (ref 3.8–10.8)

## 2016-07-07 LAB — WET PREP FOR TRICH, YEAST, CLUE
CLUE CELLS WET PREP: NONE SEEN
TRICH WET PREP: NONE SEEN

## 2016-07-07 MED ORDER — FLUCONAZOLE 100 MG PO TABS: 100.0000 mg | ORAL_TABLET | Freq: Every day | ORAL | 1 refills | Status: DC

## 2016-07-07 MED ORDER — METRONIDAZOLE 500 MG PO TABS: 500.0000 mg | ORAL_TABLET | Freq: Two times a day (BID) | ORAL | 0 refills | Status: DC

## 2016-07-07 MED ORDER — FLUCONAZOLE 150 MG PO TABS: ORAL_TABLET | ORAL | 1 refills | Status: DC

## 2016-07-07 NOTE — Progress Notes (Signed)
Sue SiasJoanna M Cruz December 05, 1990 [id number]   History:    26 y.o.  for annual gyn exam who has not been seen in the office since 2015 as a result of the birth of her last child. Last year she had a Nexplanon placed on her left arm at another facility. She was complaining today of several days of vaginal discharge and pruritus. She states she is in a monogamous relationship. Patient was also complaining of dyspareunia and low pelvic pain sometimes the right greater than her left. Also complaining of tiredness and fatigue. She reports very light if any menstrual cycles as a result of the Nexplanon. Patient reports no past history of any abnormal Pap smear.   Past medical history,surgical history, family history and social history were all reviewed and documented in the EPIC chart.  Gynecologic History Patient's last menstrual period was 06/16/2016 (approximate). Contraception: Nexplanon Last Pap: 2016. Results were: normal Last mammogram: Not indicated. Results were: Not indicated  Obstetric History OB History  Gravida Para Term Preterm AB Living  2 2 1     2   SAB TAB Ectopic Multiple Live Births        0 2    # Outcome Date GA Lbr Len/2nd Weight Sex Delivery Anes PTL Lv  2 Term 12/30/14 4066w1d  8 lb 13.6 oz (4.015 kg) F Vag-Spont None  LIV     Birth Comments: none  1 Para      Vag-Spont   LIV       ROS: A ROS was performed and pertinent positives and negatives are included in the history.  GENERAL: No fevers or chills. HEENT: No change in vision, no earache, sore throat or sinus congestion. NECK: No pain or stiffness. CARDIOVASCULAR: No chest pain or pressure. No palpitations. PULMONARY: No shortness of breath, cough or wheeze. GASTROINTESTINAL: No abdominal pain, nausea, vomiting or diarrhea, melena or bright red blood per rectum. GENITOURINARY: No urinary frequency, urgency, hesitancy or dysuria. MUSCULOSKELETAL: No joint or muscle pain, no back pain, no recent trauma. DERMATOLOGIC: No  rash, no itching, no lesions. ENDOCRINE: No polyuria, polydipsia, no heat or cold intolerance. No recent change in weight. HEMATOLOGICAL: No anemia or easy bruising or bleeding. NEUROLOGIC: No headache, seizures, numbness, tingling or weakness. PSYCHIATRIC: No depression, no loss of interest in normal activity or change in sleep pattern.     Exam: chaperone present  BP 100/64   Ht 5' 1.25" (1.556 m)   Wt 143 lb 9.6 oz (65.1 kg)   LMP 06/16/2016 (Approximate)   Breastfeeding? No   BMI 26.91 kg/m   Body mass index is 26.91 kg/m.  General appearance : Well developed well nourished female. No acute distress HEENT: Eyes: no retinal hemorrhage or exudates,  Neck supple, trachea midline, no carotid bruits, no thyroidmegaly Lungs: Clear to auscultation, no rhonchi or wheezes, or rib retractions  Heart: Regular rate and rhythm, no murmurs or gallops Breast:Examined in sitting and supine position were symmetrical in appearance, no palpable masses or tenderness,  no skin retraction, no nipple inversion, no nipple discharge, no skin discoloration, no axillary or supraclavicular lymphadenopathy Abdomen: no palpable masses or tenderness, no rebound or guarding Extremities: no edema or skin discoloration or tenderness  Pelvic:  Bartholin, Urethra, Skene Glands: Within normal limits             Vagina: File clear discharge  Cervix: No gross lesions or discharge  Uterus  anteverted, normal size, shape and consistency, non-tender and mobile  Adnexa  Without masses or tenderness  Anus and perineum  normal   Rectovaginal  normal sphincter tone without palpated masses or tenderness             Hemoccult not indicated   Wet prep moderate yeast moderate white blood cells moderate bacteria  Assessment/Plan:  26 y.o. female for annual exam who appears to have yeast vaginitis and possibly underlying bacterial vaginosis. A GC and Chlamydia culture was obtained results pending at time of this dictation.  Patient will be prescribed Diflucan 150 mg one by mouth today and then begin Flagyl 500 mg twice a day for 7 days starting tomorrow. Because of her tiredness and fatigue we'll be checking a CBC along with her TSH and a urinalysis part of her annual screen. Because of her dyspareunia and pelvic pains she'll return back to the office next week for an ultrasound. Pap smear not indicated this year.   Ok Edwards MD, 3:06 PM 07/07/2016

## 2016-07-07 NOTE — Telephone Encounter (Signed)
Per note today "Diflucan 150 mg one by mouth today and then begin Flagyl 500 mg twice a day for 7 days starting tomorrow" incorrect Rx was sent I will send Diflucan Rx as noted in chart.

## 2016-07-08 LAB — URINALYSIS W MICROSCOPIC + REFLEX CULTURE
BILIRUBIN URINE: NEGATIVE
Casts: NONE SEEN [LPF]
Crystals: NONE SEEN [HPF]
GLUCOSE, UA: NEGATIVE
Hgb urine dipstick: NEGATIVE
Ketones, ur: NEGATIVE
NITRITE: NEGATIVE
PH: 6 (ref 5.0–8.0)
Protein, ur: NEGATIVE
RBC / HPF: NONE SEEN RBC/HPF (ref <=2)
SPECIFIC GRAVITY, URINE: 1.01 (ref 1.001–1.035)
YEAST: NONE SEEN [HPF]

## 2016-07-08 LAB — URINE CULTURE: Organism ID, Bacteria: NO GROWTH

## 2016-07-08 LAB — TSH: TSH: 1.32 mIU/L

## 2016-07-09 LAB — GC/CHLAMYDIA PROBE AMP
CT PROBE, AMP APTIMA: NOT DETECTED
GC PROBE AMP APTIMA: NOT DETECTED

## 2016-07-14 ENCOUNTER — Other Ambulatory Visit: Payer: Medicaid Other

## 2016-07-14 ENCOUNTER — Ambulatory Visit: Payer: Medicaid Other | Admitting: Gynecology

## 2016-07-22 ENCOUNTER — Other Ambulatory Visit: Payer: Medicaid Other

## 2016-07-22 ENCOUNTER — Ambulatory Visit: Payer: Medicaid Other | Admitting: Gynecology

## 2016-08-02 ENCOUNTER — Other Ambulatory Visit: Payer: Self-pay | Admitting: Gynecology

## 2016-08-02 ENCOUNTER — Ambulatory Visit (INDEPENDENT_AMBULATORY_CARE_PROVIDER_SITE_OTHER): Payer: Medicaid Other

## 2016-08-02 ENCOUNTER — Ambulatory Visit (INDEPENDENT_AMBULATORY_CARE_PROVIDER_SITE_OTHER): Payer: Medicaid Other | Admitting: Gynecology

## 2016-08-02 ENCOUNTER — Encounter: Payer: Self-pay | Admitting: Gynecology

## 2016-08-02 VITALS — BP 118/80

## 2016-08-02 DIAGNOSIS — N83202 Unspecified ovarian cyst, left side: Secondary | ICD-10-CM | POA: Diagnosis not present

## 2016-08-02 DIAGNOSIS — Z8742 Personal history of other diseases of the female genital tract: Secondary | ICD-10-CM

## 2016-08-02 DIAGNOSIS — R102 Pelvic and perineal pain: Secondary | ICD-10-CM

## 2016-08-02 NOTE — Patient Instructions (Signed)
Quiste ovárico °(Ovarian Cyst) °Un quiste ovárico es un saco lleno de líquido o sangre. Este saco está unido al ovario. Algunos quistes desaparecen solos. Otros quistes necesitan tratamiento. °CUIDADOS EN EL HOGAR °· Solo tome los medicamentos que le haya indicado su médico. °· Concurra a las consultas de control con el médico, según las indicaciones. °· Hágase exámenes pélvicos regulares y pruebas de Papanicolaou. ° °SOLICITE AYUDA SI: °· Sus períodos se atrasan o son irregulares o dolorosos. °· Sus períodos cesan. °· Siente dolor en el vientre (abdominal) o en la pelvis que no desaparece. °· El abdomen se agranda o se inflama hincha. °· Tiene dificultades para orinar (vaciar por completo la vejiga). °· Siente presión en la vejiga. °· Siente dolor durante las relaciones sexuales. °· Siente distensión, presión o molestias en el estómago. °· Pierde peso sin ningún motivo. °· Se siente mal constantemente. °· Tiene dificultades para defecar (estreñimiento). °· No tiene ganas de comer. °· Le aparecen granos (acné). °· Nota un aumento del vello corporal y facial. °· Sube de peso sin motivo. °· Sospecha que está embarazada. ° °SOLICITE AYUDA DE INMEDIATO SI: °· El dolor en el vientre empeora. °· Tiene malestar estomacal (náuseas) y vomita. °· Le sube la fiebre rápidamente. °· Le duele el estómago mientras defeca. °· Los períodos menstruales son más abundantes de lo habitual. ° °ASEGÚRESE DE QUE: °· Comprende estas instrucciones. °· Controlará su afección. °· Recibirá ayuda de inmediato si no mejora o si empeora. ° °Esta información no tiene como fin reemplazar el consejo del médico. Asegúrese de hacerle al médico cualquier pregunta que tenga. °Document Released: 11/15/2012 Document Revised: 06/29/2015 Document Reviewed: 06/29/2015 °Elsevier Interactive Patient Education © 2017 [employer] ° °

## 2016-08-02 NOTE — Progress Notes (Signed)
   Patient is a 26 year old who currently has a Nexplanon that was placed last year the birth of her child. Patient at that office visit in May for annual exam and described occasional dyspareunia and right lower abdominal discomfort and prior to her last pregnancy she had history of ovarian cysts and is here for follow-up ultrasound. She was a systematic today.  Ultrasound today: Uterus measures 7.5 x 5.9 x 4.3 cm with endometrial stripe of 5.2 mm. There was some fluid in the cul-de-sac 22 x 17 mm. Right ovary was normal. Left ovary thinwall echo-free simple follicle cyst with negative color flow avascular measuring 3.9 x 2.4 x 2.4 cm average size 2.9 cm.  Assessment/plan: Patient with simple functional cyst in the left ovary was reassured. We had offered her Depo-Provera 150 mg IM and repeat ultrasound 3 months or just watching repeat a scan in 3 months which she has decided to proceed with just a follow-up ultrasound in 3 months.

## 2016-10-20 ENCOUNTER — Other Ambulatory Visit: Payer: Self-pay | Admitting: Obstetrics & Gynecology

## 2016-10-20 DIAGNOSIS — N83202 Unspecified ovarian cyst, left side: Secondary | ICD-10-CM

## 2016-11-03 ENCOUNTER — Ambulatory Visit: Payer: Medicaid Other | Admitting: Obstetrics & Gynecology

## 2016-11-03 ENCOUNTER — Other Ambulatory Visit: Payer: Medicaid Other

## 2016-12-27 ENCOUNTER — Other Ambulatory Visit: Payer: Medicaid Other

## 2016-12-27 ENCOUNTER — Ambulatory Visit: Payer: Medicaid Other | Admitting: Obstetrics & Gynecology

## 2017-01-19 ENCOUNTER — Encounter: Payer: Self-pay | Admitting: Obstetrics & Gynecology

## 2017-01-19 ENCOUNTER — Ambulatory Visit (INDEPENDENT_AMBULATORY_CARE_PROVIDER_SITE_OTHER): Payer: Medicaid Other

## 2017-01-19 ENCOUNTER — Ambulatory Visit (INDEPENDENT_AMBULATORY_CARE_PROVIDER_SITE_OTHER): Payer: Medicaid Other | Admitting: Obstetrics & Gynecology

## 2017-01-19 VITALS — BP 120/82

## 2017-01-19 DIAGNOSIS — N911 Secondary amenorrhea: Secondary | ICD-10-CM

## 2017-01-19 DIAGNOSIS — Z113 Encounter for screening for infections with a predominantly sexual mode of transmission: Secondary | ICD-10-CM

## 2017-01-19 DIAGNOSIS — N83202 Unspecified ovarian cyst, left side: Secondary | ICD-10-CM | POA: Diagnosis not present

## 2017-01-19 DIAGNOSIS — N94818 Other vulvodynia: Secondary | ICD-10-CM | POA: Diagnosis not present

## 2017-01-19 DIAGNOSIS — R6882 Decreased libido: Secondary | ICD-10-CM | POA: Diagnosis not present

## 2017-01-19 NOTE — Progress Notes (Signed)
    Tedd SiasJoanna M Maclaughlin [birth year] [id number]        26 y.o.  G2P2 Married.  Son 599 yo, daughter 26 yo.  RP:  F/U Left ovarian cyst with Pelvic US  HPI:  On Nexplanon.  No menses anymore.  Vulvar discomfort.  Low libido.  Left ovarian cyst on Pelvic US 3 months ago.  Mild pelvic pain intermittently.  Not taking any pain medication.  Past medical history,surgical history, problem list, medications, allergies, family history and social history were all reviewed and documented in the EPIC chart.  Directed ROS with pertinent positives and negatives documented in the history of present illness/assessment and plan.  Exam:  Vitals:   01/19/17 1521  BP: 120/82   General appearance:  Normal  Pelvic US today: T/V retroverted homogeneous uterus measuring 8.49 x 5.52 x 4.26 cm.  Endometrial lining normal at 1.6 mm.  Right ovary normal.  Left ovary with a thin-walled echo-free simple cyst measuring 4.4 x 3.7 x 3.2 cm.  Negative color flow Doppler.  Small simple left ovarian cyst measuring 1.6 x 1.8 cm.  Small amount of free fluid in the posterior cul-de-sac measuring 2.4 x 0.8 cm.  Arterial blood flow is present to the left ovary.    Assessment/Plan:  26 y.o. G2P1002   1. Left ovarian cyst Persistent left ovarian cyst since last ultrasound.  Still functional appearance.  Given minimal pelvic pain, will continue to observe.  Risk of rupture and torsion discussed.  Patient will call for evaluation if severe pelvic pain.  2. Vulvar discomfort Small area of ulceration.  Rule out genital herpes.  No previous history. - SureSwab HSV, Type 1/2 DNA, PCR  3. Secondary amenorrhea Amenorrhea on Nexplanon.  Probably normal secondary to Nexplanon but will rule out endocrine dysfunctions. - TSH - Prolactin - Testos,Total,Free and SHBG (Female)  4. Low libido Given that the patient is on Nexplanon, will consider alternative contraception if labs normal and no improvement in libido with today's counseling. -  TSH - Testos,Total,Free and SHBG (Female)  Counseling on above issues more than 50% for 25 minutes.  Genia DelMarie-Lyne Armanie Ullmer MD, 4:09 PM 01/19/2017

## 2017-01-21 LAB — SURESWAB HSV, TYPE 1/2 DNA, PCR
HSV 1 DNA: NOT DETECTED
HSV 2 DNA: NOT DETECTED

## 2017-01-23 ENCOUNTER — Encounter: Payer: Self-pay | Admitting: Obstetrics & Gynecology

## 2017-01-23 LAB — PROLACTIN: Prolactin: 11.7 ng/mL

## 2017-01-23 LAB — TSH: TSH: 0.87 mIU/L

## 2017-01-23 LAB — TESTOS,TOTAL,FREE AND SHBG (FEMALE)
Free Testosterone: 1.4 pg/mL (ref 0.1–6.4)
Sex Hormone Binding: 69 nmol/L (ref 17–124)
Testosterone, Total, LC-MS-MS: 17 ng/dL (ref 2–45)

## 2017-01-23 NOTE — Patient Instructions (Addendum)
1. Left ovarian cyst Persistent left ovarian cyst since last ultrasound.  Still functional appearance.  Given minimal pelvic pain, will continue to observe.  Risk of rupture and torsion discussed.  Patient will call for evaluation if severe pelvic pain.  2. Vulvar discomfort Small area of ulceration.  Rule out genital herpes.  No previous history. - SureSwab HSV, Type 1/2 DNA, PCR  3. Secondary amenorrhea Amenorrhea on Nexplanon.  Probably normal secondary to Nexplanon but will rule out endocrine dysfunctions. - TSH - Prolactin - Testos,Total,Free and SHBG (Female)  4. Low libido Given that the patient is on Nexplanon, will consider alternative contraception if labs normal and no improvement in libido with today's counseling. - TSH - Testos,Total,Free and SHBG (Female)  Sue Cruz, it was a pleasure meeting you today!  I will inform you of your results as soon as available.

## 2017-04-06 ENCOUNTER — Other Ambulatory Visit: Payer: Self-pay | Admitting: Obstetrics & Gynecology

## 2017-04-06 DIAGNOSIS — N83202 Unspecified ovarian cyst, left side: Secondary | ICD-10-CM

## 2017-04-20 ENCOUNTER — Ambulatory Visit (INDEPENDENT_AMBULATORY_CARE_PROVIDER_SITE_OTHER): Payer: Self-pay

## 2017-04-20 ENCOUNTER — Ambulatory Visit (INDEPENDENT_AMBULATORY_CARE_PROVIDER_SITE_OTHER): Payer: Medicaid Other | Admitting: Obstetrics & Gynecology

## 2017-04-20 DIAGNOSIS — N83202 Unspecified ovarian cyst, left side: Secondary | ICD-10-CM

## 2017-04-20 DIAGNOSIS — Z3046 Encounter for surveillance of implantable subdermal contraceptive: Secondary | ICD-10-CM

## 2017-04-20 DIAGNOSIS — R350 Frequency of micturition: Secondary | ICD-10-CM

## 2017-04-20 LAB — PREGNANCY, URINE: Preg Test, Ur: NEGATIVE

## 2017-04-20 NOTE — Progress Notes (Signed)
    Sue Cruz [birth year] [id number]        27 y.o.  G2P2 Married.  Son 209 yo, daughter 27 yo.  RP:  F/U Left ovarian cyst with Pelvic US  HPI:  C/O Nausea, dizziness and fatigue.  No menses on Nexplanon.  Continued intermittent pelvic pain.  Not taking any pain medication.  Pelvic US 01/19/2017 showed a simple Left Ovarian Cyst at 4.4 cm.  OB History  Gravida Para Term Preterm AB Living  2 2 1     2   SAB TAB Ectopic Multiple Live Births        0 2    # Outcome Date GA Lbr Len/2nd Weight Sex Delivery Anes PTL Lv  2 Term 12/30/14 7047w1d  8 lb 13.6 oz (4.015 kg) F Vag-Spont None  LIV     Birth Comments: none  1 Para      Vag-Spont   LIV      Past medical history,surgical history, problem list, medications, allergies, family history and social history were all reviewed and documented in the EPIC chart.   Directed ROS with pertinent positives and negatives documented in the history of present illness/assessment and plan.  Exam:  There were no vitals filed for this visit. General appearance:  Normal  Pelvic US today: T/V images.  Uterus is anteverted and homogeneous measuring 7.6 x 6.24 x 4.4 cm.  The endometrial lining is normal and thin at 3.6 mm.  Right ovary normal.  Left ovary enlarged with a thin walled echo-free avascular cyst measuring 4.5 x 2.6 x 3.8 cm.  No free fluid in the posterior cul-de-sac.  U/A: Cloudy, nitrites negative, red blood cells 10-20, white blood cells more than 60, many bacteria.  Assessment/Plan:  27 y.o. G2P1002   1. Left ovarian cyst Stable simple Lt Ovarian Cyst with mild Sxs.  Will f/u with a repeat Pelvic US in 3 months and Annual/Gyn visit. - US Transvaginal Non-OB; Future   2. Urinary frequency U/A mildly abnormal.  Will wait on U. Culture before treating.  3. Encounter for surveillance of implantable subdermal contraceptive Nausea/ urinary frequency, r/o pregnancy.  UPT negative - Pregnancy, urine  Counseling and coordination of care  on above issues more than 50% of 15 minutes. Sue DelMarie-Lyne Limmie Schoenberg MD, 11:57 AM 04/20/2017

## 2017-04-22 LAB — URINALYSIS, COMPLETE W/RFL CULTURE
Bilirubin Urine: NEGATIVE
Glucose, UA: NEGATIVE
Hyaline Cast: NONE SEEN /LPF
KETONES UR: NEGATIVE
NITRITES URINE, INITIAL: NEGATIVE
PH: 6 (ref 5.0–8.0)
SPECIFIC GRAVITY, URINE: 1.02 (ref 1.001–1.03)
WBC, UA: >=60 /HPF — AB (ref 0–5)

## 2017-04-22 LAB — URINE CULTURE
MICRO NUMBER:: 90324825
RESULT: NO GROWTH
SPECIMEN QUALITY:: ADEQUATE

## 2017-04-22 LAB — CULTURE INDICATED

## 2017-04-25 ENCOUNTER — Encounter: Payer: Self-pay | Admitting: Obstetrics & Gynecology

## 2017-04-25 NOTE — Patient Instructions (Addendum)
1. Left ovarian cyst Stable simple Lt Ovarian Cyst with mild Sxs.  Will f/u with a repeat Pelvic US in 3 months and Annual/Gyn visit. - US Transvaginal Non-OB; Future   2. Urinary frequency U/A mildly abnormal.  Will wait on U. Culture before treating.  3. Encounter for surveillance of implantable subdermal contraceptive Nausea/ urinary frequency, r/o pregnancy.  UPT negative - Pregnancy, urine  Mardene [name], good seeing you today!  I will inform you of your results as soon as they are available.

## 2017-05-26 ENCOUNTER — Ambulatory Visit (INDEPENDENT_AMBULATORY_CARE_PROVIDER_SITE_OTHER): Payer: Medicaid Other | Admitting: Obstetrics & Gynecology

## 2017-05-26 ENCOUNTER — Encounter: Payer: Self-pay | Admitting: Obstetrics & Gynecology

## 2017-05-26 VITALS — BP 114/78 | Temp 98.1°F

## 2017-05-26 DIAGNOSIS — R102 Pelvic and perineal pain: Secondary | ICD-10-CM

## 2017-05-26 DIAGNOSIS — Z3046 Encounter for surveillance of implantable subdermal contraceptive: Secondary | ICD-10-CM

## 2017-05-26 DIAGNOSIS — Z113 Encounter for screening for infections with a predominantly sexual mode of transmission: Secondary | ICD-10-CM

## 2017-05-26 MED ORDER — SULFAMETHOXAZOLE-TRIMETHOPRIM 800-160 MG PO TABS: 1.0000 | ORAL_TABLET | Freq: Two times a day (BID) | ORAL | 0 refills | Status: AC

## 2017-05-26 NOTE — Patient Instructions (Signed)
1. Pelvic pain in female Pelvic pain worsening for 1 month, but normal gynecologic exam today.  Simple left ovarian cyst at 4.5 cm per last pelvic ultrasound April 20, 2017.  The possibility of proceeding with a diagnostic laparoscopy left ovarian cystectomy discussed with patient who declines at this time.  Prefers to remove the Nexplanon in case this is contributing to her symptoms.  Will use condoms in the meantime.  Rule out STDs.  Rule out cystitis.  Follow-up with a pelvic ultrasound as planned June 2019 and will do her annual gynecologic exam at the same time. - Urinalysis,Complete w/RFL Culture  2. Screening examination for venereal disease  - C. trachomatis/N. gonorrhoeae RNA  3. Nexplanon removal Easy removal of the Nexplanon at patient's request.  Post Nexplanon removal counseling done.  Will use condoms.  Sue Cruz, good seeing you today!

## 2017-05-26 NOTE — Progress Notes (Addendum)
Sue Cruz [birth year] [id number]        27 y.o.  G2P1002  Married  RP: Worsening pelvic pain on Nexplanon  HPI: Nexplanon x 2017.  Continued amenorrhea on it.  Since last visit with me 04/20/2017, C/O persistent worsening pelvic pain, now bilaterally and irradiating to the lower back and left leg.  C/O deep dyspareunia.  Last visit, pelvic US showed a stable simple Left Ovarian Cyst at 4.5 cm.  Normal vaginal secretions.  No fever.  Urine normal, BMs wnl.  Desires removal of Nexplanon, will use condoms.    OB History  Gravida Para Term Preterm AB Living  2 2 1     2   SAB TAB Ectopic Multiple Live Births        0 2    # Outcome Date GA Lbr Len/2nd Weight Sex Delivery Anes PTL Lv  2 Term 12/30/14 [redacted]w[redacted]d  8 lb 13.6 oz (4.015 kg) F Vag-Spont None  LIV     Birth Comments: none  1 Para      Vag-Spont   LIV    Past medical history,surgical history, problem list, medications, allergies, family history and social history were all reviewed and documented in the EPIC chart.   Directed ROS with pertinent positives and negatives documented in the history of present illness/assessment and plan.  Exam:  Vitals:   05/26/17 0938  BP: 114/78  Temp: 98.1 F (36.7 C)   General appearance:  Normal  Abdomen: Normal, soft, NT  Gynecologic exam: Vulva normal.  Speculum: Cervix normal.  No erythema.  Vagina normal.  Normal vaginal secretions.  Gonorrhea and chlamydia done.  Bimanual exam: Uterus anteverted, normal volume, nontender, mobile.  No adnexal mass felt, nontender.                                                             Nexplanon procedure note (removal)  The patient presented to the office today requesting for removal of her Nexplanon that was placed in the year 2017 on her Left arm.   On examination the nexplanon implant was palpated and the distal end  (end  closest to the elbow) was marked. The area was sterilized with Betadine solution. 1% lidocaine was used for local  anesthesia and approximately 1 cc  was injected into the site that was marked where the incision was to be made. The local anesthetic was injected under the implant in an effort to keep it  close to the skin surface. Slight pressure pushing downward was made at the proximal end  of the implant in an effort to stabilize it. A bulge appeared indicating the distal end of the implant. A small transverse incision of 2 mm was made at that location. By gently pushing the implant toward the incision, the tip became visible. Grasping the implant with a curved forcep facilitated in gently removing the implant. Full confirmation of the entire implant which is 4 cm long was inspected and was intact and was shown to the patient and discarded. After removing the implant, the incision was closed with 3Steri-Strips, a band-aid and a bandage. Patient will be instructed to remove the pressure bandage in 24 hours, the band-aid in 3 days and the Steri-Strips in 7 days.  U/A: Yellow cloudy, nitrites negative, white blood  cells 20-40, red blood cells negative, bacteria moderate.  Urine culture pending.  Assessment/Plan:  27 y.o. G2P1002   1. Pelvic pain in female Pelvic pain worsening for 1 month, but normal gynecologic exam today.  Simple left ovarian cyst at 4.5 cm per last pelvic ultrasound April 20, 2017.  The possibility of proceeding with a diagnostic laparoscopy left ovarian cystectomy discussed with patient who declines at this time.  Prefers to remove the Nexplanon in case this is contributing to her symptoms.  Will use condoms in the meantime.  Rule out STDs.  Rule out cystitis.  Will treat with Bactrim pending urine culture.  Follow-up with a pelvic ultrasound as planned June 2019 and will do her annual gynecologic exam at the same time. - Urinalysis,Complete w/RFL Culture  2. Screening examination for venereal disease  - C. trachomatis/N. gonorrhoeae RNA  3. Nexplanon removal Easy removal of the Nexplanon at  patient's request.  Post Nexplanon removal counseling done.  Will use condoms.  Counseling on above issues and coordination of care more than 50% for 25 minutes.  Genia DelMarie-Lyne Giada Schoppe MD, 9:46 AM 05/26/2017

## 2017-05-26 NOTE — Addendum Note (Signed)
Addended by: Genia DelLAVOIE, MARIE-LYNE on: 05/26/2017 03:09 PM   Modules accepted: Orders

## 2017-05-27 LAB — C. TRACHOMATIS/N. GONORRHOEAE RNA
C. trachomatis RNA, TMA: NOT DETECTED
N. gonorrhoeae RNA, TMA: NOT DETECTED

## 2017-05-28 LAB — URINALYSIS, COMPLETE W/RFL CULTURE
Bilirubin Urine: NEGATIVE
GLUCOSE, UA: NEGATIVE
HGB URINE DIPSTICK: NEGATIVE
Hyaline Cast: NONE SEEN /LPF
Ketones, ur: NEGATIVE
NITRITES URINE, INITIAL: NEGATIVE
PH: 5.5 (ref 5.0–8.0)
Protein, ur: NEGATIVE
RBC / HPF: NONE SEEN /HPF (ref 0–2)
SPECIFIC GRAVITY, URINE: 1.01 (ref 1.001–1.03)

## 2017-05-28 LAB — URINE CULTURE
MICRO NUMBER:: 90483389
RESULT: NO GROWTH
SPECIMEN QUALITY:: ADEQUATE

## 2017-05-28 LAB — CULTURE INDICATED

## 2017-06-30 ENCOUNTER — Emergency Department (HOSPITAL_COMMUNITY)
Admission: EM | Admit: 2017-06-30 | Discharge: 2017-06-30 | Disposition: A | Discharge status: Home / Self Care | Payer: Medicaid Other | Attending: Emergency Medicine | Admitting: Emergency Medicine

## 2017-06-30 ENCOUNTER — Other Ambulatory Visit: Payer: Self-pay

## 2017-06-30 ENCOUNTER — Encounter (HOSPITAL_COMMUNITY): Payer: Self-pay | Admitting: Emergency Medicine

## 2017-06-30 DIAGNOSIS — Z79899 Other long term (current) drug therapy: Secondary | ICD-10-CM | POA: Insufficient documentation

## 2017-06-30 DIAGNOSIS — Z209 Contact with and (suspected) exposure to unspecified communicable disease: Secondary | ICD-10-CM | POA: Insufficient documentation

## 2017-06-30 DIAGNOSIS — J069 Acute upper respiratory infection, unspecified: Secondary | ICD-10-CM

## 2017-06-30 DIAGNOSIS — B309 Viral conjunctivitis, unspecified: Secondary | ICD-10-CM

## 2017-06-30 DIAGNOSIS — Z3202 Encounter for pregnancy test, result negative: Secondary | ICD-10-CM | POA: Insufficient documentation

## 2017-06-30 LAB — POC URINE PREG, ED: Preg Test, Ur: NEGATIVE

## 2017-06-30 LAB — GROUP A STREP BY PCR: GROUP A STREP BY PCR: NOT DETECTED

## 2017-06-30 MED ORDER — ERYTHROMYCIN 5 MG/GM OP OINT: TOPICAL_OINTMENT | OPHTHALMIC | 0 refills | Status: DC

## 2017-06-30 NOTE — Discharge Instructions (Signed)
Use erythromycin ointment 4 times daily on the left eye Use cool compresses on the left eye and wash hands frequently Take over the counter cough/cold medicines Return if worsening

## 2017-06-30 NOTE — ED Triage Notes (Signed)
Patient to ED c/o sore throat, chills, R ear pain, and headache onset this morning. Patient states painful to swallow. Afebrile in triage. Also requesting pregnancy test, LMP 05/09/17.

## 2017-06-30 NOTE — ED Provider Notes (Signed)
[facility] EMERGENCY DEPARTMENT Provider Note   CSN: [id number] Arrival date & time: 06/30/17  5621     History   Chief Complaint Chief Complaint  Patient presents with  . Sore Throat    HPI Sue Cruz is a 27 y.o. female who presents with URI symptoms and request for pregnancy test. No significant PMH. She states that this morning she woke up with an acute onset of left eye redness and irritation, sore throat, headache, fever and body aches. 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 She does have a young child at home who had pink eye last week. She has not tried anything prior to arrival. She is requesting a pregnancy test because she's been having some intermittent lower abdominal and lower back pain intermittently and hasn't had a period since early April.   HPI  Past Medical History:  Diagnosis Date  . Medical history non-contributory     Patient Active Problem List   Diagnosis Date Noted  . Personal history of ovarian cyst 07/07/2016    Past Surgical History:  Procedure Laterality Date  . WISDOM TOOTH EXTRACTION       OB History    Gravida  2   Para  2   Term  1   Preterm      AB      Living  2     SAB      TAB      Ectopic      Multiple  0   Live Births  2            Home Medications    Prior to Admission medications   Medication Sig Start Date End Date Taking? Authorizing Provider  erythromycin ophthalmic ointment Place a 1/2 inch ribbon of ointment into the lower eyelid 4 times daily 06/30/17   Bethel Born, PA-C  Etonogestrel [facility]) Inject 1 application into the skin once. Implanted February 2013  05/26/17  [provider]    Family History No family history on file.  Social History Social History   Tobacco Use  . Smoking status: Never Smoker  . Smokeless tobacco: Never Used  Substance Use Topics  . Alcohol use: No  . Drug use: No     Allergies   Patient has no  known allergies.   Review of Systems Review of Systems  Constitutional: Positive for chills and fever.  HENT: Positive for sore throat. Negative for congestion, ear pain and rhinorrhea.   Eyes: Positive for redness.  Respiratory: Negative for cough and shortness of breath.   Musculoskeletal: Positive for myalgias.  Neurological: Positive for headaches.     Physical Exam Updated Vital Signs BP 110/77 (BP Location: Right Arm)   Pulse 64   Temp 98.3 F (36.8 C) (Oral)   Resp 16   LMP 05/09/2017 (Exact Date)   SpO2 100%   Physical Exam  Constitutional: She is oriented to person, place, and time. She appears well-developed and well-nourished. No distress.  Calm and cooperative. Well appearing  HENT:  Head: Normocephalic and atraumatic.  Right Ear: Tympanic membrane normal.  Left Ear: Tympanic membrane normal.  Nose: Nose normal.  Mouth/Throat: Uvula is midline, oropharynx is clear and moist and mucous membranes are normal.  Eyes: Pupils are equal, round, and reactive to light. EOM are normal. Right eye exhibits no discharge. Left eye exhibits no discharge. Left conjunctiva is injected. No scleral icterus.  Neck: Normal range of motion.  Cardiovascular: Normal rate and regular rhythm.  Pulmonary/Chest: Effort normal and breath sounds normal. No respiratory distress.  Abdominal: She exhibits no distension.  Neurological: She is alert and oriented to person, place, and time.  Skin: Skin is warm and dry.  Psychiatric: She has a normal mood and affect. Her behavior is normal.  Nursing note and vitals reviewed.    ED Treatments / Results  Labs (all labs ordered are listed, but only abnormal results are displayed) Labs Reviewed  GROUP A STREP BY PCR  POC URINE PREG, ED    EKG None  Radiology No results found.  Procedures Procedures (including critical care time)  Medications Ordered in ED Medications - No data to display   Initial Impression / Assessment and Plan  / ED Course  I have reviewed the triage vital signs and the nursing notes.  Pertinent labs & imaging results that were available during my care of the patient were reviewed by me and considered in my medical decision making (see chart for details).  27 year old female presents with acute onset of left eye redness, sore throat and subjective fever and aches. Her vitals are normal and she is very well appearing here. Exam is overall unremarkable. Strep test is negative. Preg test is negative. Likely viral. Will rx erythromycin ointment to cover for secondary infection and advised supportive care.  Final Clinical Impressions(s) / ED Diagnoses   Final diagnoses:  Acute viral conjunctivitis of left eye  Viral upper respiratory tract infection    ED Discharge Orders        Ordered    erythromycin ophthalmic ointment     06/30/17 1301       Bethel Born, PA-C 06/30/17 1310    Pricilla Loveless, MD 06/30/17 [phone]

## 2017-07-11 ENCOUNTER — Ambulatory Visit: Payer: Self-pay | Admitting: Obstetrics and Gynecology

## 2017-07-25 ENCOUNTER — Encounter: Payer: Medicaid Other | Admitting: Obstetrics & Gynecology

## 2017-07-25 ENCOUNTER — Other Ambulatory Visit: Payer: Medicaid Other

## 2017-08-01 ENCOUNTER — Ambulatory Visit: Payer: Self-pay | Admitting: Obstetrics and Gynecology

## 2017-08-15 ENCOUNTER — Other Ambulatory Visit (HOSPITAL_COMMUNITY)
Admission: RE | Admit: 2017-08-15 | Discharge: 2017-08-15 | Disposition: A | Discharge status: Home / Self Care | Payer: Medicaid Other | Source: Ambulatory Visit | Attending: Obstetrics and Gynecology | Admitting: Obstetrics and Gynecology

## 2017-08-15 ENCOUNTER — Encounter: Payer: Self-pay | Admitting: Obstetrics and Gynecology

## 2017-08-15 ENCOUNTER — Ambulatory Visit (INDEPENDENT_AMBULATORY_CARE_PROVIDER_SITE_OTHER): Payer: Medicaid Other | Admitting: Obstetrics and Gynecology

## 2017-08-15 VITALS — BP 116/79 | HR 76 | Ht 60.0 in | Wt 151.0 lb

## 2017-08-15 DIAGNOSIS — Z0000 Encounter for general adult medical examination without abnormal findings: Secondary | ICD-10-CM | POA: Diagnosis not present

## 2017-08-15 DIAGNOSIS — Z01419 Encounter for gynecological examination (general) (routine) without abnormal findings: Secondary | ICD-10-CM

## 2017-08-15 DIAGNOSIS — Z3202 Encounter for pregnancy test, result negative: Secondary | ICD-10-CM | POA: Diagnosis not present

## 2017-08-15 DIAGNOSIS — N761 Subacute and chronic vaginitis: Secondary | ICD-10-CM

## 2017-08-15 LAB — POCT URINE PREGNANCY: Preg Test, Ur: NEGATIVE

## 2017-08-15 MED ORDER — MEDROXYPROGESTERONE ACETATE 150 MG/ML IM SUSP: 150.0000 mg | INTRAMUSCULAR | 4 refills | Status: DC

## 2017-08-15 NOTE — Progress Notes (Signed)
Pt complains of having vaginal discharge. Denies odor, itching, or discoloration.   Pt would like to start depo shot for birth control. Pt was last sexually active this past Friday. No protection used.

## 2017-08-15 NOTE — Progress Notes (Signed)
Subjective:     Sue Cruz is a 27 y.o. female G2P2 with LMP 07/22/2017 and BMI 29.49 who is here for a comprehensive physical exam. The patient reports the presence of a white discharge with occasional odor. She also reports some occasional hematuria. She denies frequency, urgency or dysuria. Patient is sexually active using nothing for contraception. Patient denies any pelvic pain or abnormal discharge. Patient reports a monthly 4-day cycle. She is interested in starting depo-provera for contraception  Past Medical History:  Diagnosis Date  . Medical history non-contributory    Past Surgical History:  Procedure Laterality Date  . WISDOM TOOTH EXTRACTION      History reviewed. No pertinent family history.  Social History   Socioeconomic History  . Marital status: Married    Spouse name: Not on file  . Number of children: Not on file  . Years of education: Not on file  . Highest education level: Not on file  Occupational History  . Not on file  Social Needs  . Financial resource strain: Not on file  . Food insecurity:    Worry: Not on file    Inability: Not on file  . Transportation needs:    Medical: Not on file    Non-medical: Not on file  Tobacco Use  . Smoking status: Never Smoker  . Smokeless tobacco: Never Used  Substance and Sexual Activity  . Alcohol use: No  . Drug use: No  . Sexual activity: Yes    Partners: Male    Birth control/protection: None  Lifestyle  . Physical activity:    Days per week: Not on file    Minutes per session: Not on file  . Stress: Not on file  Relationships  . Social connections:    Talks on phone: Not on file    Gets together: Not on file    Attends religious service: Not on file    Active member of club or organization: Not on file    Attends meetings of clubs or organizations: Not on file    Relationship status: Not on file  . Intimate partner violence:    Fear of current or ex partner: Not on file    Emotionally abused:  Not on file    Physically abused: Not on file    Forced sexual activity: Not on file  Other Topics Concern  . Not on file  Social History Narrative  . Not on file   Health Maintenance  Topic Date Due  . INFLUENZA VACCINE  09/08/2017  . PAP SMEAR  10/14/2017  . TETANUS/TDAP  12/31/2024  . HIV Screening  Completed       Review of Systems Pertinent items are noted in HPI.   Objective:  Blood pressure 116/79, pulse 76, height 5' (1.524 m), weight 151 lb (68.5 kg), last menstrual period 07/22/2017.     GENERAL: Well-developed, well-nourished female in no acute distress.  HEENT: Normocephalic, atraumatic. Sclerae anicteric.  NECK: Supple. Normal thyroid.  LUNGS: Clear to auscultation bilaterally.  HEART: Regular rate and rhythm. BREASTS: Symmetric in size. No palpable masses or lymphadenopathy, skin changes, or nipple drainage. ABDOMEN: Soft, nontender, nondistended. No organomegaly. PELVIC: Normal external female genitalia. Vagina is pink and rugated.  Normal discharge. Normal appearing cervix. Uterus is normal in size. No adnexal mass or tenderness. EXTREMITIES: No cyanosis, clubbing, or edema, 2+ distal pulses.    Assessment:    Healthy female exam.      Plan:    pap smear collected  Wet prep and urine culture collected Patient will be contacted with abnormal results Patient with unprotected intercourse last time on 7/5. Patient to return in 2 weeks for urine pregnancy test and initiation of depo-provera Patient will be contacted with abnormal results See After Visit Summary for Counseling Recommendations

## 2017-08-16 LAB — CYTOLOGY - PAP: Diagnosis: NEGATIVE

## 2017-08-16 LAB — CERVICOVAGINAL ANCILLARY ONLY
Bacterial vaginitis: POSITIVE — AB
CANDIDA VAGINITIS: POSITIVE — AB
Chlamydia: NEGATIVE
Neisseria Gonorrhea: NEGATIVE
TRICH (WINDOWPATH): NEGATIVE

## 2017-08-16 MED ORDER — METRONIDAZOLE 500 MG PO TABS: 500.0000 mg | ORAL_TABLET | Freq: Two times a day (BID) | ORAL | 0 refills | Status: DC

## 2017-08-16 MED ORDER — FLUCONAZOLE 150 MG PO TABS: 150.0000 mg | ORAL_TABLET | Freq: Once | ORAL | 0 refills | Status: AC

## 2017-08-16 NOTE — Addendum Note (Signed)
Addended by: Catalina AntiguaONSTANT, Edge Mauger on: 08/16/2017 03:59 PM   Modules accepted: Orders

## 2017-08-17 LAB — URINE CULTURE: ORGANISM ID, BACTERIA: NO GROWTH

## 2017-08-29 ENCOUNTER — Ambulatory Visit (INDEPENDENT_AMBULATORY_CARE_PROVIDER_SITE_OTHER): Payer: Medicaid Other

## 2017-08-29 DIAGNOSIS — Z3042 Encounter for surveillance of injectable contraceptive: Secondary | ICD-10-CM | POA: Diagnosis not present

## 2017-08-29 DIAGNOSIS — Z3202 Encounter for pregnancy test, result negative: Secondary | ICD-10-CM | POA: Diagnosis not present

## 2017-08-29 LAB — POCT URINE PREGNANCY: Preg Test, Ur: NEGATIVE

## 2017-08-29 MED ORDER — MEDROXYPROGESTERONE ACETATE 150 MG/ML IM SUSP
150.0000 mg | Freq: Once | INTRAMUSCULAR | Status: AC
  Administered 2017-08-29: 150 mg via INTRAMUSCULAR

## 2017-08-29 NOTE — Addendum Note (Signed)
Addended by: Dalphine HandingGARDNER, Kirstein Baxley L on: 08/29/2017 11:55 AM   Modules accepted: Orders

## 2017-08-29 NOTE — Progress Notes (Signed)
Nurse visit for initial Depo given R arm w/o difficulty. UPT neg today. Pt denies unprotected IC x 14 days and is on her period. Next Depo due 10/7-10/21

## 2017-11-14 ENCOUNTER — Ambulatory Visit: Payer: Medicaid Other

## 2017-11-14 ENCOUNTER — Encounter: Payer: Self-pay | Admitting: Obstetrics and Gynecology

## 2017-11-14 ENCOUNTER — Ambulatory Visit (INDEPENDENT_AMBULATORY_CARE_PROVIDER_SITE_OTHER): Payer: Medicaid Other | Admitting: Obstetrics and Gynecology

## 2017-11-14 VITALS — BP 112/71 | HR 74 | Wt 150.4 lb

## 2017-11-14 DIAGNOSIS — Z30011 Encounter for initial prescription of contraceptive pills: Secondary | ICD-10-CM

## 2017-11-14 DIAGNOSIS — Z309 Encounter for contraceptive management, unspecified: Secondary | ICD-10-CM | POA: Diagnosis not present

## 2017-11-14 NOTE — Progress Notes (Signed)
27 yo G2P2 here for contraception counseling. Patient initiated depo-provera in July and states that she desires a contraception method which gives her a monthly period. Patient is also contemplating pregnancy within the next 12 months. She is without complaints. She is on time with her depo-provera. She denies any vaginal bleeding thus far. She reports having irregular bleeding with the nexplanon and is thinking of using that method again. Patient is without complaints. She denies pelvic pain or abnormal discharge.  Past Medical History:  Diagnosis Date  . Medical history non-contributory    Past Surgical History:  Procedure Laterality Date  . WISDOM TOOTH EXTRACTION     No family history on file. Social History   Tobacco Use  . Smoking status: Never Smoker  . Smokeless tobacco: Never Used  Substance Use Topics  . Alcohol use: No  . Drug use: No   ROS See pertinent in HPI  Blood pressure 112/71, pulse 74, weight 150 lb 6.4 oz (68.2 kg). GENERAL: Well-developed, well-nourished female in no acute distress.  NEURO: alert and oriented x 3 EXTREMITIES: No cyanosis, clubbing, or edema, 2+ distal pulses.  A/P 27 yo here for change in contraception - Options reviewed. Patient opted for COC - 3 month sample of balcoltra provided. Patient will contact the office if she desires refills or return for Nexplanon insertion - Patient with normal pap smear 08/2017 - RTC prn

## 2017-11-14 NOTE — Progress Notes (Signed)
RGYN patient here to discuss possible contraception management.  Last depo Injection: 08/29/17 Last pap: 08/15/2017 LMP: no periods with depo       cc: pt wants cycle monthly pt states she is considering pills or nexplanon.

## 2017-12-09 ENCOUNTER — Telehealth: Payer: Self-pay

## 2017-12-09 NOTE — Telephone Encounter (Signed)
Patient called and wanted to know if she should start her new pack of birth control pills after she finishes the blue (placebo) pills. I advised her that she should start the new pack after she finishes the blue pills, pt verbalizes understanding.

## 2018-02-03 ENCOUNTER — Other Ambulatory Visit: Payer: Self-pay

## 2018-02-03 ENCOUNTER — Encounter (HOSPITAL_COMMUNITY): Payer: Self-pay | Admitting: Emergency Medicine

## 2018-02-03 ENCOUNTER — Emergency Department (HOSPITAL_COMMUNITY)
Admission: EM | Admit: 2018-02-03 | Discharge: 2018-02-03 | Disposition: A | Discharge status: Home / Self Care | Payer: Medicaid Other | Attending: Emergency Medicine | Admitting: Emergency Medicine

## 2018-02-03 DIAGNOSIS — J111 Influenza due to unidentified influenza virus with other respiratory manifestations: Secondary | ICD-10-CM | POA: Insufficient documentation

## 2018-02-03 DIAGNOSIS — R509 Fever, unspecified: Secondary | ICD-10-CM | POA: Diagnosis present

## 2018-02-03 DIAGNOSIS — Z79899 Other long term (current) drug therapy: Secondary | ICD-10-CM | POA: Diagnosis not present

## 2018-02-03 DIAGNOSIS — R69 Illness, unspecified: Secondary | ICD-10-CM

## 2018-02-03 MED ORDER — GUAIFENESIN ER 600 MG PO TB12: 600.0000 mg | ORAL_TABLET | Freq: Two times a day (BID) | ORAL | 0 refills | Status: DC

## 2018-02-03 MED ORDER — BENZONATATE 100 MG PO CAPS: 100.0000 mg | ORAL_CAPSULE | Freq: Three times a day (TID) | ORAL | 0 refills | Status: DC

## 2018-02-03 MED ORDER — FLUTICASONE PROPIONATE 50 MCG/ACT NA SUSP: 2.0000 | Freq: Every day | NASAL | 0 refills | Status: DC

## 2018-02-03 MED ORDER — IBUPROFEN 400 MG PO TABS
600.0000 mg | ORAL_TABLET | Freq: Once | ORAL | Status: AC | PRN
  Administered 2018-02-03: 600 mg via ORAL
  Filled 2018-02-03: qty 1

## 2018-02-03 NOTE — ED Triage Notes (Signed)
Pt with 3 days of fever, cough body aches and chills x3 days.

## 2018-02-03 NOTE — ED Provider Notes (Signed)
[facility]   CSN: [id number] Arrival date & time: 02/03/18  1441     History   Chief Complaint Chief Complaint  Patient presents with  . Flu like symptoms    HPI Sue Cruz is a 27 y.o. female.  HPI   Pt is a 27 y/o female with no PMHx who presents to the ED today c/o fevers, chills, sweats, body aches, headaches, cough, rhinorrhea, congestion, and sore throat that began 4 days ago. Cough is nonproductive. No sob or chest pain. No abd pain, NVD, or urinary sxs.   Sxs have been constant since onset and have not worsened or improved. Has tried tylenol without relief.  Past Medical History:  Diagnosis Date  . Medical history non-contributory     Patient Active Problem List   Diagnosis Date Noted  . Personal history of ovarian cyst 07/07/2016    Past Surgical History:  Procedure Laterality Date  . WISDOM TOOTH EXTRACTION       OB History    Gravida  2   Para  2   Term  2   Preterm      AB      Living  2     SAB      TAB      Ectopic      Multiple  0   Live Births  2            Home Medications    Prior to Admission medications   Medication Sig Start Date End Date Taking? Authorizing Provider  benzonatate (TESSALON) 100 MG capsule Take 1 capsule (100 mg total) by mouth every 8 (eight) hours. 02/03/18   Josefine Fuhr S, PA-C  fluticasone (FLONASE) 50 MCG/ACT nasal spray Place 2 sprays into both nostrils daily. 02/03/18   Macaiah Mangal S, PA-C  guaiFENesin (MUCINEX) 600 MG 12 hr tablet Take 1 tablet (600 mg total) by mouth 2 (two) times daily. 02/03/18   Jhan Conery S, PA-C  medroxyPROGESTERone (DEPO-PROVERA) 150 MG/ML injection Inject 1 mL (150 mg total) into the muscle every 3 (three) months. 08/15/17   Constant, Peggy, MD  metroNIDAZOLE (FLAGYL) 500 MG tablet Take 1 tablet (500 mg total) by mouth 2 (two) times daily. Patient not taking: Reported on 11/14/2017 08/16/17   Constant,  Peggy, MD    Family History No family history on file.  Social History Social History   Tobacco Use  . Smoking status: Never Smoker  . Smokeless tobacco: Never Used  Substance Use Topics  . Alcohol use: No  . Drug use: No     Allergies   Patient has no known allergies.   Review of Systems Review of Systems  Constitutional: Positive for chills and fever.  HENT: Positive for congestion, rhinorrhea and sore throat.   Eyes: Negative for visual disturbance.  Respiratory: Positive for cough. Negative for shortness of breath.   Gastrointestinal: Negative for abdominal pain, constipation, diarrhea, nausea and vomiting.  Genitourinary: Negative for dysuria.  Skin: Negative for rash.  Neurological: Positive for headaches.     Physical Exam Updated Vital Signs BP 117/67   Pulse 87   Temp 98.5 F (36.9 C) (Oral)   Resp 18   Ht 5' (1.524 m)   Wt 68.2 kg   SpO2 99%   BMI 29.36 kg/m   Physical Exam Vitals signs and nursing note reviewed.  Constitutional:      General: She is not in acute distress.  Appearance: She is well-developed.  HENT:     Head: Normocephalic and atraumatic.     Right Ear: Tympanic membrane normal.     Left Ear: Tympanic membrane normal.     Nose:     Comments: Nasal turbinates swollen bilaterally, rhinorrhea present.  No maxillary or frontal sinus tenderness.    Mouth/Throat:     Mouth: Mucous membranes are moist.     Pharynx: No oropharyngeal exudate or posterior oropharyngeal erythema.  Eyes:     Conjunctiva/sclera: Conjunctivae normal.  Neck:     Musculoskeletal: Neck supple.  Cardiovascular:     Rate and Rhythm: Normal rate and regular rhythm.     Heart sounds: Normal heart sounds. No murmur.  Pulmonary:     Effort: Pulmonary effort is normal. No respiratory distress.     Breath sounds: Normal breath sounds. No stridor. No wheezing or rhonchi.  Abdominal:     General: Bowel sounds are normal.     Palpations: Abdomen is soft.      Tenderness: There is no abdominal tenderness. There is no guarding or rebound.  Skin:    General: Skin is warm and dry.  Neurological:     Mental Status: She is alert.  Psychiatric:        Mood and Affect: Mood normal.      ED Treatments / Results  Labs (all labs ordered are listed, but only abnormal results are displayed) Labs Reviewed - No data to display  EKG None  Radiology No results found.  Procedures Procedures (including critical care time)  Medications Ordered in ED Medications  ibuprofen (ADVIL,MOTRIN) tablet 600 mg (600 mg Oral Given 02/03/18 1624)     Initial Impression / Assessment and Plan / ED Course  I have reviewed the triage vital signs and the nursing notes.  Pertinent labs & imaging results that were available during my care of the patient were reviewed by me and considered in my medical decision making (see chart for details).     Final Clinical Impressions(s) / ED Diagnoses   Final diagnoses:  Influenza-like illness   Patient with symptoms consistent with influenza.  Vitals are stable, low-grade fever.  No signs of dehydration.  Lungs are clear. Due to patient's presentation and physical exam a chest x-ray was not ordered bc likely diagnosis of flu.  Discussed the cost versus benefit of Tamiflu treatment with the patient.  The patient understands that symptoms are greater than the recommended 24-48 hour window of treatment.  Patient will be discharged with instructions to orally hydrate, rest, and use over-the-counter medications such as anti-inflammatories ibuprofen and Aleve for muscle aches and Tylenol for fever.  Patient will also be given a cough suppressant.    ED Discharge Orders         Ordered    guaiFENesin (MUCINEX) 600 MG 12 hr tablet  2 times daily     02/03/18 1847    benzonatate (TESSALON) 100 MG capsule  Every 8 hours     02/03/18 1847    fluticasone (FLONASE) 50 MCG/ACT nasal spray  Daily     02/03/18 1847             Karrie MeresCouture, Billiejean Schimek S, PA-C 02/03/18 1849    Melene PlanFloyd, Dan, DO 02/03/18 1900

## 2018-02-03 NOTE — ED Notes (Signed)
Patient verbalizes understanding of discharge instructions. Opportunity for questioning and answers were provided. Armband removed by staff, pt discharged from ED ambulatory.   

## 2018-02-03 NOTE — Discharge Instructions (Signed)

## 2018-02-10 ENCOUNTER — Ambulatory Visit (HOSPITAL_COMMUNITY)
Admission: EM | Admit: 2018-02-10 | Discharge: 2018-02-10 | Disposition: A | Discharge status: Home / Self Care | Payer: Medicaid Other | Attending: Internal Medicine | Admitting: Internal Medicine

## 2018-02-10 ENCOUNTER — Encounter (HOSPITAL_COMMUNITY): Payer: Self-pay | Admitting: Emergency Medicine

## 2018-02-10 DIAGNOSIS — H66003 Acute suppurative otitis media without spontaneous rupture of ear drum, bilateral: Secondary | ICD-10-CM | POA: Diagnosis not present

## 2018-02-10 LAB — POCT PREGNANCY, URINE: Preg Test, Ur: NEGATIVE

## 2018-02-10 MED ORDER — AMOXICILLIN 875 MG PO TABS: 875.0000 mg | ORAL_TABLET | Freq: Two times a day (BID) | ORAL | 0 refills | Status: AC

## 2018-02-10 NOTE — ED Provider Notes (Signed)
[facility]    CSN: [id number] Arrival date & time: 02/10/18  1831     History   Chief Complaint Chief Complaint  Patient presents with  . Otalgia    HPI Sue Cruz is a 28 y.o. female.   28 year old female presents to urgent care complaining of left ear pain x2 days.  The patient reports that she had flu last week and feels as if she may have had some fever yesterday.  She denies nausea, vomiting or diarrhea.     Past Medical History:  Diagnosis Date  . Medical history non-contributory     Patient Active Problem List   Diagnosis Date Noted  . Personal history of ovarian cyst 07/07/2016    Past Surgical History:  Procedure Laterality Date  . WISDOM TOOTH EXTRACTION      OB History    Gravida  2   Para  2   Term  2   Preterm      AB      Living  2     SAB      TAB      Ectopic      Multiple  0   Live Births  2            Home Medications    Prior to Admission medications   Medication Sig Start Date End Date Taking? Authorizing Provider  amoxicillin (AMOXIL) 875 MG tablet Take 1 tablet (875 mg total) by mouth 2 (two) times daily for 10 days. 02/10/18 02/20/18  Arnaldo Natal, MD  benzonatate (TESSALON) 100 MG capsule Take 1 capsule (100 mg total) by mouth every 8 (eight) hours. 02/03/18   Couture, Cortni S, PA-C  fluticasone (FLONASE) 50 MCG/ACT nasal spray Place 2 sprays into both nostrils daily. 02/03/18   Couture, Cortni S, PA-C  guaiFENesin (MUCINEX) 600 MG 12 hr tablet Take 1 tablet (600 mg total) by mouth 2 (two) times daily. 02/03/18   Couture, Cortni S, PA-C  medroxyPROGESTERone (DEPO-PROVERA) 150 MG/ML injection Inject 1 mL (150 mg total) into the muscle every 3 (three) months. Patient not taking: Reported on 02/10/2018 08/15/17   Constant, Peggy, MD  metroNIDAZOLE (FLAGYL) 500 MG tablet Take 1 tablet (500 mg total) by mouth 2 (two) times daily. Patient not taking: Reported on 11/14/2017 08/16/17   Constant, Peggy, MD     Family History No family history on file.  Social History Social History   Tobacco Use  . Smoking status: Never Smoker  . Smokeless tobacco: Never Used  Substance Use Topics  . Alcohol use: No  . Drug use: No     Allergies   Patient has no known allergies.   Review of Systems Review of Systems  Constitutional: Negative for chills and fever.  HENT: Positive for ear pain. Negative for sore throat and tinnitus.   Eyes: Negative for redness.  Respiratory: Negative for cough and shortness of breath.   Cardiovascular: Negative for chest pain and palpitations.  Gastrointestinal: Negative for abdominal pain, diarrhea, nausea and vomiting.  Genitourinary: Negative for dysuria, frequency and urgency.  Musculoskeletal: Negative for myalgias.  Skin: Negative for rash.       No lesions  Neurological: Negative for weakness.  Hematological: Does not bruise/bleed easily.  Psychiatric/Behavioral: Negative for suicidal ideas.     Physical Exam Triage Vital Signs ED Triage Vitals  Enc Vitals Group     BP 02/10/18 1900 (!) 116/56     Pulse Rate 02/10/18 1900 84  Resp 02/10/18 1900 16     Temp 02/10/18 1900 98.6 F (37 C)     Temp src --      SpO2 02/10/18 1900 99 %     Weight --      Height --      Head Circumference --      Peak Flow --      Pain Score 02/10/18 1901 0     Pain Loc --      Pain Edu? --      Excl. in GC? --    No data found.  Updated Vital Signs BP (!) 116/56   Pulse 84   Temp 98.6 F (37 C)   Resp 16   LMP 01/27/2018   SpO2 99%   Visual Acuity Right Eye Distance:   Left Eye Distance:   Bilateral Distance:    Right Eye Near:   Left Eye Near:    Bilateral Near:     Physical Exam Vitals signs and nursing note reviewed.  Constitutional:      General: She is not in acute distress.    Appearance: She is well-developed.  HENT:     Head: Normocephalic and atraumatic.     Right Ear: A middle ear effusion is present. Tympanic membrane is  bulging.     Left Ear: A middle ear effusion is present. Tympanic membrane is bulging.  Eyes:     General: No scleral icterus.    Conjunctiva/sclera: Conjunctivae normal.     Pupils: Pupils are equal, round, and reactive to light.  Neck:     Musculoskeletal: Normal range of motion and neck supple.     Thyroid: No thyromegaly.     Vascular: No JVD.     Trachea: No tracheal deviation.  Cardiovascular:     Rate and Rhythm: Normal rate and regular rhythm.     Heart sounds: Normal heart sounds. No murmur. No friction rub. No gallop.   Pulmonary:     Effort: Pulmonary effort is normal.     Breath sounds: Normal breath sounds.  Abdominal:     General: Bowel sounds are normal. There is no distension.     Palpations: Abdomen is soft.     Tenderness: There is no abdominal tenderness.  Musculoskeletal: Normal range of motion.  Lymphadenopathy:     Cervical: No cervical adenopathy.  Skin:    General: Skin is warm and dry.  Neurological:     Mental Status: She is alert and oriented to person, place, and time.     Cranial Nerves: No cranial nerve deficit.  Psychiatric:        Behavior: Behavior normal.        Thought Content: Thought content normal.        Judgment: Judgment normal.      UC Treatments / Results  Labs (all labs ordered are listed, but only abnormal results are displayed) Labs Reviewed  POCT PREGNANCY, URINE    EKG None  Radiology No results found.  Procedures Procedures (including critical care time)  Medications Ordered in UC Medications - No data to display  Initial Impression / Assessment and Plan / UC Course  I have reviewed the triage vital signs and the nursing notes.  Pertinent labs & imaging results that were available during my care of the patient were reviewed by me and considered in my medical decision making (see chart for details).     Purulent effusions of both tympanic membranes.  Final Clinical Impressions(s) /  UC Diagnoses   Final  diagnoses:  Acute suppurative otitis media of both ears without spontaneous rupture of tympanic membranes, recurrence not specified   Discharge Instructions   None    ED Prescriptions    Medication Sig Dispense Auth. Provider   amoxicillin (AMOXIL) 875 MG tablet Take 1 tablet (875 mg total) by mouth 2 (two) times daily for 10 days. 20 tablet Arnaldo Nataliamond, Karin Pinedo S, MD     Controlled Substance Prescriptions [place] Controlled Substance Registry consulted? Not Applicable   Arnaldo Nataliamond, Mykeisha Dysert S, MD 02/10/18 2008

## 2018-02-10 NOTE — ED Notes (Signed)
Patient able to ambulate independently  

## 2018-02-10 NOTE — ED Triage Notes (Signed)
Pt c/o L ear pain since yesterday.

## 2018-03-20 ENCOUNTER — Emergency Department (HOSPITAL_COMMUNITY)
Admission: EM | Admit: 2018-03-20 | Discharge: 2018-03-21 | Disposition: A | Discharge status: Home / Self Care | Payer: Medicaid Other | Attending: Emergency Medicine | Admitting: Emergency Medicine

## 2018-03-20 ENCOUNTER — Encounter (HOSPITAL_COMMUNITY): Payer: Self-pay

## 2018-03-20 ENCOUNTER — Other Ambulatory Visit: Payer: Self-pay

## 2018-03-20 DIAGNOSIS — Z79899 Other long term (current) drug therapy: Secondary | ICD-10-CM | POA: Insufficient documentation

## 2018-03-20 DIAGNOSIS — M545 Low back pain: Secondary | ICD-10-CM | POA: Diagnosis not present

## 2018-03-20 DIAGNOSIS — N898 Other specified noninflammatory disorders of vagina: Secondary | ICD-10-CM | POA: Insufficient documentation

## 2018-03-20 DIAGNOSIS — R1030 Lower abdominal pain, unspecified: Secondary | ICD-10-CM | POA: Diagnosis present

## 2018-03-20 DIAGNOSIS — R102 Pelvic and perineal pain: Secondary | ICD-10-CM | POA: Insufficient documentation

## 2018-03-20 LAB — URINALYSIS, ROUTINE W REFLEX MICROSCOPIC
Bilirubin Urine: NEGATIVE
Glucose, UA: NEGATIVE mg/dL
Hgb urine dipstick: NEGATIVE
Ketones, ur: NEGATIVE mg/dL
Leukocytes, UA: NEGATIVE
NITRITE: NEGATIVE
Protein, ur: NEGATIVE mg/dL
SPECIFIC GRAVITY, URINE: 1.025 (ref 1.005–1.030)
pH: 7 (ref 5.0–8.0)

## 2018-03-20 LAB — COMPREHENSIVE METABOLIC PANEL
ALT: 18 U/L (ref 0–44)
AST: 21 U/L (ref 15–41)
Albumin: 4 g/dL (ref 3.5–5.0)
Alkaline Phosphatase: 56 U/L (ref 38–126)
Anion gap: 9 (ref 5–15)
BUN: 9 mg/dL (ref 6–20)
CHLORIDE: 105 mmol/L (ref 98–111)
CO2: 25 mmol/L (ref 22–32)
Calcium: 9.3 mg/dL (ref 8.9–10.3)
Creatinine, Ser: 0.75 mg/dL (ref 0.44–1.00)
GFR calc Af Amer: >60 mL/min (ref >60)
GFR calc non Af Amer: >60 mL/min (ref >60)
Glucose, Bld: 108 mg/dL — ABNORMAL HIGH (ref 70–99)
POTASSIUM: 3.5 mmol/L (ref 3.5–5.1)
Sodium: 139 mmol/L (ref 135–145)
Total Bilirubin: 0.5 mg/dL (ref 0.3–1.2)
Total Protein: 7 g/dL (ref 6.5–8.1)

## 2018-03-20 LAB — CBC
HEMATOCRIT: 39.4 % (ref 36.0–46.0)
HEMOGLOBIN: 12.7 g/dL (ref 12.0–15.0)
MCH: 26.7 pg (ref 26.0–34.0)
MCHC: 32.2 g/dL (ref 30.0–36.0)
MCV: 82.8 fL (ref 80.0–100.0)
Platelets: 345 10*3/uL (ref 150–400)
RBC: 4.76 MIL/uL (ref 3.87–5.11)
RDW: 12.2 % (ref 11.5–15.5)
WBC: 11.3 10*3/uL — ABNORMAL HIGH (ref 4.0–10.5)
nRBC: 0 % (ref 0.0–0.2)

## 2018-03-20 LAB — I-STAT BETA HCG BLOOD, ED (MC, WL, AP ONLY): I-stat hCG, quantitative: <5 m[IU]/mL (ref <5)

## 2018-03-20 LAB — LIPASE, BLOOD: Lipase: 30 U/L (ref 11–51)

## 2018-03-20 MED ORDER — SODIUM CHLORIDE 0.9% FLUSH: 3.0000 mL | Freq: Once | INTRAVENOUS | Status: DC

## 2018-03-20 NOTE — ED Triage Notes (Signed)
Pt arrives POV for eval of lower abd pain x 5 hours. Pt reports sudden onset lower abd pain this afternoon. Denies N/V/D, denies vag bleeding/loss of fluids. LMP 1/28 unsure of pregnancy status, states neg preg test last week.

## 2018-03-21 LAB — GC/CHLAMYDIA PROBE AMP (~~LOC~~) NOT AT ARMC
Chlamydia: NEGATIVE
Neisseria Gonorrhea: NEGATIVE

## 2018-03-21 LAB — WET PREP, GENITAL
Clue Cells Wet Prep HPF POC: NONE SEEN
Sperm: NONE SEEN
TRICH WET PREP: NONE SEEN
Yeast Wet Prep HPF POC: NONE SEEN

## 2018-03-21 NOTE — ED Notes (Signed)
Patient verbalizes understanding of discharge instructions. No further questions at this time. VSS and patient ambulatory at discharge.

## 2018-03-21 NOTE — ED Provider Notes (Signed)
[facility] Provider Note   CSN: [id number] Arrival date & time: 03/20/18  2121     History   Chief Complaint Chief Complaint  Patient presents with  . Abdominal Pain    HPI Sue Cruz is a 28 y.o. female.  Patient to ED with complaint of pain across the lower abdomen and lower back that started several hours prior to arrival in the emergency department. No fever, nausea, vomiting, diarrhea. She reports last bowel movement was last night and straining to pass it caused increased pain. She reports vaginal discharge but that it is the same discharge without change that she has had for the past several months. She has been treated with metronidazole for same by her gynecologist without change. She states she is not concerned about STD's. She is unsure of her pregnancy status. No urinary symptoms.   The history is provided by the patient. No language interpreter was used.  Abdominal Pain  Associated symptoms: vaginal discharge   Associated symptoms: no chills, no constipation, no diarrhea, no dysuria, no fever, no nausea, no vaginal bleeding and no vomiting     Past Medical History:  Diagnosis Date  . Medical history non-contributory     Patient Active Problem List   Diagnosis Date Noted  . Personal history of ovarian cyst 07/07/2016    Past Surgical History:  Procedure Laterality Date  . WISDOM TOOTH EXTRACTION       OB History    Gravida  2   Para  2   Term  2   Preterm      AB      Living  2     SAB      TAB      Ectopic      Multiple  0   Live Births  2            Home Medications    Prior to Admission medications   Medication Sig Start Date End Date Taking? Authorizing Provider  benzonatate (TESSALON) 100 MG capsule Take 1 capsule (100 mg total) by mouth every 8 (eight) hours. 02/03/18   Couture, Cortni S, PA-C  fluticasone (FLONASE) 50 MCG/ACT nasal spray Place 2 sprays into both nostrils daily.  02/03/18   Couture, Cortni S, PA-C  guaiFENesin (MUCINEX) 600 MG 12 hr tablet Take 1 tablet (600 mg total) by mouth 2 (two) times daily. 02/03/18   Couture, Cortni S, PA-C  medroxyPROGESTERone (DEPO-PROVERA) 150 MG/ML injection Inject 1 mL (150 mg total) into the muscle every 3 (three) months. Patient not taking: Reported on 02/10/2018 08/15/17   Constant, Peggy, MD  metroNIDAZOLE (FLAGYL) 500 MG tablet Take 1 tablet (500 mg total) by mouth 2 (two) times daily. Patient not taking: Reported on 11/14/2017 08/16/17   Constant, Peggy, MD    Family History History reviewed. No pertinent family history.  Social History Social History   Tobacco Use  . Smoking status: Never Smoker  . Smokeless tobacco: Never Used  Substance Use Topics  . Alcohol use: No  . Drug use: No     Allergies   Patient has no known allergies.   Review of Systems Review of Systems  Constitutional: Negative for chills and fever.  Gastrointestinal: Positive for abdominal pain. Negative for constipation, diarrhea, nausea and vomiting.  Genitourinary: Positive for vaginal discharge. Negative for dysuria, flank pain, menstrual problem and vaginal bleeding.  Musculoskeletal: Positive for back pain.  Skin: Negative.   Neurological: Negative.  Physical Exam Updated Vital Signs BP 108/69   Pulse 70   Temp 97.9 F (36.6 C) (Oral)   Resp 16   Ht 5' (1.524 m)   Wt 67.6 kg   SpO2 99%   BMI 29.10 kg/m   Physical Exam Constitutional:      Appearance: She is well-developed.  Neck:     Musculoskeletal: Normal range of motion.  Pulmonary:     Effort: Pulmonary effort is normal.  Abdominal:     General: Abdomen is flat.     Palpations: Abdomen is soft.     Tenderness: There is no abdominal tenderness.     Comments: Pain of complaint is not reproducible to palpation of the abdominal wall.   Genitourinary:    Vagina: Vaginal discharge (White, consistent discharge) present.     Cervix: No cervical motion  tenderness or friability.     Uterus: Normal.      Adnexa: Right adnexa normal and left adnexa normal.       Right: No tenderness.         Left: No tenderness.    Skin:    General: Skin is warm and dry.  Neurological:     Mental Status: She is alert and oriented to person, place, and time.      ED Treatments / Results  Labs (all labs ordered are listed, but only abnormal results are displayed) Labs Reviewed  COMPREHENSIVE METABOLIC PANEL - Abnormal; Notable for the following components:      Result Value   Glucose, Bld 108 (*)    All other components within normal limits  CBC - Abnormal; Notable for the following components:   WBC 11.3 (*)    All other components within normal limits  WET PREP, GENITAL  LIPASE, BLOOD  URINALYSIS, ROUTINE W REFLEX MICROSCOPIC  I-STAT BETA HCG BLOOD, ED ([facility], [facility], [facility] ONLY)  GC/CHLAMYDIA PROBE AMP ([place]) NOT AT [facility]    EKG None  Radiology No results found.  Procedures Procedures (including critical care time)  Medications Ordered in ED Medications  sodium chloride flush (NS) 0.9 % injection 3 mL (has no administration in time range)     Initial Impression / Assessment and Plan / ED Course  I have reviewed the triage vital signs and the nursing notes.  Pertinent labs & imaging results that were available during my care of the patient were reviewed by me and considered in my medical decision making (see chart for details).     Patient to ED with c/o pelvic pain. No fever, nausea. She reports negative pregnancy test at home.   Exam is reassuring. No CMT or purulent discharge to suggest PID or STD. She has a white discharge that is not purulent. She reports discharge for months, unchanged. Labs are unremarkable.   She states later that she stopped birth control pills at the end of December and had an abnormal period in January. She thought she was pregnant, reporting breast tenderness. Pregnancy test is negative here.   She is  likely experiencing changes from coming off OPC's. She does not want to wait for her wet prep. She has scheduled follow up with GYN and is encouraged to keep this appointment.   Final Clinical Impressions(s) / ED Diagnoses   Final diagnoses:  None   1. Pelvic pain  ED Discharge Orders    None       Elpidio AnisUpstill, Bao Coreas, Cordelia Poche-C 03/21/18 0524    Dione BoozeGlick, David, MD 03/21/18 [phone]

## 2018-03-21 NOTE — Discharge Instructions (Addendum)
Keep your scheduled appointment with your gynecologist for further evaluation and recheck of current symptoms. REturn to the ED with any new or concerning symptoms.

## 2018-03-21 NOTE — ED Notes (Signed)
Patient requesting to leave at this time. MD made aware.

## 2018-03-22 ENCOUNTER — Other Ambulatory Visit: Payer: Self-pay | Admitting: Family Medicine

## 2018-03-22 DIAGNOSIS — R102 Pelvic and perineal pain: Secondary | ICD-10-CM

## 2018-03-27 ENCOUNTER — Ambulatory Visit
Admission: RE | Admit: 2018-03-27 | Discharge: 2018-03-27 | Disposition: A | Discharge status: Home / Self Care | Payer: Medicaid Other | Source: Ambulatory Visit | Attending: Family Medicine | Admitting: Family Medicine

## 2018-03-27 DIAGNOSIS — N83291 Other ovarian cyst, right side: Secondary | ICD-10-CM | POA: Diagnosis not present

## 2018-03-27 DIAGNOSIS — R102 Pelvic and perineal pain: Secondary | ICD-10-CM

## 2018-03-30 ENCOUNTER — Other Ambulatory Visit: Payer: Self-pay | Admitting: Family Medicine

## 2018-03-30 DIAGNOSIS — N83201 Unspecified ovarian cyst, right side: Secondary | ICD-10-CM | POA: Diagnosis not present

## 2018-06-28 ENCOUNTER — Encounter (HOSPITAL_COMMUNITY): Payer: Self-pay | Admitting: Emergency Medicine

## 2018-06-28 ENCOUNTER — Other Ambulatory Visit: Payer: Self-pay

## 2018-06-28 ENCOUNTER — Emergency Department (HOSPITAL_COMMUNITY)
Admission: EM | Admit: 2018-06-28 | Discharge: 2018-06-28 | Disposition: A | Discharge status: Home / Self Care | Payer: Medicaid Other | Attending: Emergency Medicine | Admitting: Emergency Medicine

## 2018-06-28 DIAGNOSIS — N912 Amenorrhea, unspecified: Secondary | ICD-10-CM | POA: Diagnosis present

## 2018-06-28 DIAGNOSIS — R110 Nausea: Secondary | ICD-10-CM | POA: Insufficient documentation

## 2018-06-28 DIAGNOSIS — Z3201 Encounter for pregnancy test, result positive: Secondary | ICD-10-CM | POA: Diagnosis not present

## 2018-06-28 DIAGNOSIS — O26891 Other specified pregnancy related conditions, first trimester: Secondary | ICD-10-CM | POA: Diagnosis not present

## 2018-06-28 DIAGNOSIS — Z3A00 Weeks of gestation of pregnancy not specified: Secondary | ICD-10-CM | POA: Diagnosis not present

## 2018-06-28 LAB — POC URINE PREG, ED: Preg Test, Ur: POSITIVE — AB

## 2018-06-28 MED ORDER — ACETAMINOPHEN 325 MG PO TABS
650.0000 mg | ORAL_TABLET | Freq: Once | ORAL | Status: AC
  Administered 2018-06-28: 22:00:00 650 mg via ORAL
  Filled 2018-06-28: qty 2

## 2018-06-28 NOTE — ED Triage Notes (Signed)
Pt to ED st's she wants a preg test to confirm her home preg test that was positive.  Pt st's she has had nausea and breast swelling

## 2018-06-28 NOTE — ED Provider Notes (Signed)
[facility] EMERGENCY DEPARTMENT Provider Note   CSN: [id number] Arrival date & time: 06/28/18  2036    History   Chief Complaint Chief Complaint  Patient presents with  . Nausea    HPI Sue Cruz is a 28 y.o. female.     HPI   28 year old female, G2P2, presents requesting a urine pregnancy test.  Her last period was on April 20.  Patient states that she had a positive urine pregnancy at home but she does not trust it because she had a friend that had a positive urine pregnancy at home but then did not have a positive test in the hospital.  She notes some breast tenderness and some mild nausea.  She denies any abdominal pain, vaginal bleeding, fevers, vomiting.  Past Medical History:  Diagnosis Date  . Medical history non-contributory     Patient Active Problem List   Diagnosis Date Noted  . Personal history of ovarian cyst 07/07/2016    Past Surgical History:  Procedure Laterality Date  . WISDOM TOOTH EXTRACTION       OB History    Gravida  2   Para  2   Term  2   Preterm      AB      Living  2     SAB      TAB      Ectopic      Multiple  0   Live Births  2            Home Medications    Prior to Admission medications   Medication Sig Start Date End Date Taking? Authorizing Provider  benzonatate (TESSALON) 100 MG capsule Take 1 capsule (100 mg total) by mouth every 8 (eight) hours. Patient not taking: Reported on 03/21/2018 02/03/18   Couture, Cortni S, PA-C  fluticasone (FLONASE) 50 MCG/ACT nasal spray Place 2 sprays into both nostrils daily. Patient not taking: Reported on 03/21/2018 02/03/18   Couture, Cortni S, PA-C  guaiFENesin (MUCINEX) 600 MG 12 hr tablet Take 1 tablet (600 mg total) by mouth 2 (two) times daily. Patient not taking: Reported on 03/21/2018 02/03/18   Couture, Cortni S, PA-C  medroxyPROGESTERone (DEPO-PROVERA) 150 MG/ML injection Inject 1 mL (150 mg total) into the muscle every 3 (three) months.  Patient not taking: Reported on 02/10/2018 08/15/17   Constant, Peggy, MD  metroNIDAZOLE (FLAGYL) 500 MG tablet Take 1 tablet (500 mg total) by mouth 2 (two) times daily. Patient not taking: Reported on 11/14/2017 08/16/17   Constant, Peggy, MD    Family History No family history on file.  Social History Social History   Tobacco Use  . Smoking status: Never Smoker  . Smokeless tobacco: Never Used  Substance Use Topics  . Alcohol use: No  . Drug use: No     Allergies   Patient has no known allergies.   Review of Systems Review of Systems  Constitutional: Negative for chills and fever.  Respiratory: Negative for shortness of breath.   Cardiovascular: Negative for chest pain.  Gastrointestinal: Positive for nausea. Negative for abdominal pain and vomiting.  Genitourinary: Negative for dysuria and vaginal bleeding.     Physical Exam Updated Vital Signs BP 123/78 (BP Location: Right Arm)   Pulse 92   Temp 98.6 F (37 C) (Oral)   Resp 14   Ht 5' (1.524 m)   Wt 69.9 kg   LMP 05/29/2018 (Exact Date)   SpO2 100%   BMI 30.08 kg/m  Physical Exam Vitals signs and nursing note reviewed.  Constitutional:      Appearance: She is well-developed.  HENT:     Head: Normocephalic and atraumatic.  Eyes:     Conjunctiva/sclera: Conjunctivae normal.  Neck:     Musculoskeletal: Neck supple.  Cardiovascular:     Rate and Rhythm: Normal rate and regular rhythm.     Heart sounds: Normal heart sounds. No murmur.  Pulmonary:     Effort: Pulmonary effort is normal. No respiratory distress.     Breath sounds: Normal breath sounds. No wheezing or rales.  Abdominal:     General: Bowel sounds are normal. There is no distension.     Palpations: Abdomen is soft.     Tenderness: There is no abdominal tenderness.  Musculoskeletal: Normal range of motion.        General: No tenderness or deformity.  Skin:    General: Skin is warm and dry.     Findings: No erythema or rash.  Neurological:      Mental Status: She is alert and oriented to person, place, and time.  Psychiatric:        Behavior: Behavior normal.      ED Treatments / Results  Labs (all labs ordered are listed, but only abnormal results are displayed) Labs Reviewed  POC URINE PREG, ED - Abnormal; Notable for the following components:      Result Value   Preg Test, Ur POSITIVE (*)    All other components within normal limits  URINALYSIS, ROUTINE W REFLEX MICROSCOPIC    EKG None  Radiology No results found.  Procedures Procedures (including critical care time)  Medications Ordered in ED Medications  acetaminophen (TYLENOL) tablet 650 mg (has no administration in time range)     Initial Impression / Assessment and Plan / ED Course  I have reviewed the triage vital signs and the nursing notes.  Pertinent labs & imaging results that were available during my care of the patient were reviewed by me and considered in my medical decision making (see chart for details).        Patient presents requesting a urine pregnancy test.  A urine pregnancy was sent in triage.  Patient denies any abdominal pain, vaginal bleeding.  Her physical exam is unremarkable.  Her vital signs are stable.  Her urine pregnancy is positive.  Patient was informed.  She was instructed to follow-up with her OB/GYN.  She is agreeable with this plan.  She is ready and stable for discharge.  Final Clinical Impressions(s) / ED Diagnoses   Final diagnoses:  None    ED Discharge Orders    None       Rueben BashKendrick, Alphonse Asbridge S, PA-C 06/28/18 2221    Arby BarrettePfeiffer, Marcy, MD 07/05/18 [phone]

## 2018-06-28 NOTE — Discharge Instructions (Signed)
Take a daily prenatal vitamin.  Follow-up with your OB/GYN doctor within 2 to 3 weeks for continued evaluation.  Return to the ED immediately for any worsening symptoms or concerns, such as abdominal pain, vaginal bleeding or any concerns at all.

## 2018-06-29 DIAGNOSIS — Z3A01 Less than 8 weeks gestation of pregnancy: Secondary | ICD-10-CM | POA: Diagnosis not present

## 2018-07-06 ENCOUNTER — Encounter (HOSPITAL_COMMUNITY): Payer: Self-pay | Admitting: *Deleted

## 2018-07-06 ENCOUNTER — Inpatient Hospital Stay (HOSPITAL_COMMUNITY)
Admission: AD | Admit: 2018-07-06 | Discharge: 2018-07-07 | Disposition: A | Discharge status: Home / Self Care | Payer: Medicaid Other | Attending: Obstetrics and Gynecology | Admitting: Obstetrics and Gynecology

## 2018-07-06 ENCOUNTER — Other Ambulatory Visit: Payer: Self-pay

## 2018-07-06 DIAGNOSIS — O26891 Other specified pregnancy related conditions, first trimester: Secondary | ICD-10-CM | POA: Diagnosis not present

## 2018-07-06 DIAGNOSIS — O26899 Other specified pregnancy related conditions, unspecified trimester: Secondary | ICD-10-CM

## 2018-07-06 DIAGNOSIS — Z3A01 Less than 8 weeks gestation of pregnancy: Secondary | ICD-10-CM | POA: Insufficient documentation

## 2018-07-06 DIAGNOSIS — O9989 Other specified diseases and conditions complicating pregnancy, childbirth and the puerperium: Secondary | ICD-10-CM | POA: Diagnosis not present

## 2018-07-06 DIAGNOSIS — Z3A00 Weeks of gestation of pregnancy not specified: Secondary | ICD-10-CM | POA: Diagnosis not present

## 2018-07-06 DIAGNOSIS — O3680X0 Pregnancy with inconclusive fetal viability, not applicable or unspecified: Secondary | ICD-10-CM | POA: Insufficient documentation

## 2018-07-06 DIAGNOSIS — R102 Pelvic and perineal pain: Secondary | ICD-10-CM | POA: Diagnosis not present

## 2018-07-06 DIAGNOSIS — O208 Other hemorrhage in early pregnancy: Secondary | ICD-10-CM | POA: Diagnosis not present

## 2018-07-06 NOTE — MAU Note (Addendum)
Having cramps in lower abd that sometimes goes to back. Pain present for couple wks. Denies bleeding. First PN appt at [facility] in July. Sometimes gets SOB when walking or talking.

## 2018-07-06 NOTE — MAU Provider Note (Signed)
Chief Complaint: Abdominal Pain   First Provider Initiated Contact with Patient 07/06/18 2357        SUBJECTIVE HPI: Sue Cruz is a 28 y.o. G3P2002 at 6363w4d by LMP who presents to maternity admissions reporting lower abdominal cramps.  Has had this for two weeks.  . She denies vaginal bleeding, vaginal itching/burning, urinary symptoms, h/a, dizziness, n/v, or fever/chills.    RN Note: Having cramps in lower abd that sometimes goes to back. Pain present for couple wks. Denies bleeding. [facility] app[facility] at [facility] in July. Sometimes gets SOB when walking or talking.  Past Medical History:  Diagnosis Date  . Medical history non-contributory    Past Surgical History:  Procedure Laterality Date  . WISDOM TOOTH EXTRACTION     Social History   Socioeconomic History  . Marital status: Married    Spouse name: Not on file  . Number of children: Not on file  . Years of education: Not on file  . Highest education level: Not on file  Occupational History  . Not on file  Social Needs  . Financial resource strain: Not on file  . Food insecurity:    Worry: Not on file    Inability: Not on file  . Transportation needs:    Medical: Not on file    Non-medical: Not on file  Tobacco Use  . Smoking status: Never Smoker  . Smokeless tobacco: Never Used  Substance and Sexual Activity  . Alcohol use: No  . Drug use: No  . Sexual activity: Yes    Partners: Male  Lifestyle  . Physical activity:    Days per week: Not on file    Minutes per session: Not on file  . Stress: Not on file  Relationships  . Social connections:    Talks on phone: Not on file    Gets together: Not on file    Attends religious service: Not on file    Active member of club or organization: Not on file    Attends meetings of clubs or organizations: Not on file    Relationship status: Not on file  . Intimate partner violence:    Fear of current or ex partner: Not on file    Emotionally abused: Not on file   Physically abused: Not on file    Forced sexual activity: Not on file  Other Topics Concern  . Not on file  Social History Narrative  . Not on file   No current facility-administered medications on file prior to encounter.    Current Outpatient Medications on File Prior to Encounter  Medication Sig Dispense Refill  . acetaminophen (TYLENOL) 500 MG tablet Take 500 mg by mouth every 6 (six) hours as needed.    . Prenatal Vit-Fe Fumarate-FA (PRENATAL MULTIVITAMIN) TABS tablet Take 1 tablet by mouth daily at 12 noon.    . fluticasone (FLONASE) 50 MCG/ACT nasal spray Place 2 sprays into both nostrils daily. (Patient not taking: Reported on 03/21/2018) 16 g 0  . guaiFENesin (MUCINEX) 600 MG 12 hr tablet Take 1 tablet (600 mg total) by mouth 2 (two) times daily. (Patient not taking: Reported on 03/21/2018) 6 tablet 0  . metroNIDAZOLE (FLAGYL) 500 MG tablet Take 1 tablet (500 mg total) by mouth 2 (two) times daily. (Patient not taking: Reported on 11/14/2017) 14 tablet 0   No Known Allergies  I have reviewed patient's Past Medical Hx, Surgical Hx, Family Hx, Social Hx, medications and allergies.   ROS:  Review  of Systems  Constitutional: Negative for chills and fever.  Respiratory: Negative for shortness of breath.   Gastrointestinal: Positive for abdominal pain. Negative for constipation, diarrhea and nausea.  Genitourinary: Positive for pelvic pain. Negative for vaginal bleeding and vaginal discharge.   Review of Systems  Other systems negative   Physical Exam  Physical Exam Patient Vitals for the past 24 hrs:  BP Temp Pulse Resp SpO2 Height Weight  07/07/18 0218 (!) 103/58 - 70 18 - - -  07/06/18 2350 - - - - 99 % - -  07/06/18 2342 (!) 110/55 98 F (36.7 C) 77 18 - 5' (1.524 m) 73.5 kg   Constitutional: Well-developed, well-nourished female in no acute distress.  Cardiovascular: normal rate Respiratory: normal effort GI: Abd soft, non-tender. Pos BS x 4 MS: Extremities  nontender, no edema, normal ROM Neurologic: Alert and oriented x 4.  GU: Neg CVAT.  PELVIC EXAM:  Bimanual exam: Cervix 0/long/high, firm, anterior, neg CMT, uterus nontender, slightly enlarged, adnexa without tenderness, enlargement, or mass   LAB RESULTS Results for orders placed or performed during the hospital encounter of 07/06/18 (from the past 24 hour(s))  Wet prep, genital     Status: Abnormal   Collection Time: 07/06/18 11:26 PM  Result Value Ref Range   Yeast Wet Prep HPF POC NONE SEEN NONE SEEN   Trich, Wet Prep NONE SEEN NONE SEEN   Clue Cells Wet Prep HPF POC NONE SEEN NONE SEEN   WBC, Wet Prep HPF POC MANY (A) NONE SEEN   Sperm NONE SEEN   Urinalysis, Routine w reflex microscopic     Status: None   Collection Time: 07/06/18 11:47 PM  Result Value Ref Range   Color, Urine YELLOW YELLOW   APPearance CLEAR CLEAR   Specific Gravity, Urine 1.024 1.005 - 1.030   pH 6.0 5.0 - 8.0   Glucose, UA NEGATIVE NEGATIVE mg/dL   Hgb urine dipstick NEGATIVE NEGATIVE   Bilirubin Urine NEGATIVE NEGATIVE   Ketones, ur NEGATIVE NEGATIVE mg/dL   Protein, ur NEGATIVE NEGATIVE mg/dL   Nitrite NEGATIVE NEGATIVE   Leukocytes,Ua NEGATIVE NEGATIVE  CBC     Status: Abnormal   Collection Time: 07/06/18 11:54 PM  Result Value Ref Range   WBC 10.8 (H) 4.0 - 10.5 K/uL   RBC 4.57 3.87 - 5.11 MIL/uL   Hemoglobin 12.6 12.0 - 15.0 g/dL   HCT 16.1 09.6 - 04.5 %   MCV 82.7 80.0 - 100.0 fL   MCH 27.6 26.0 - 34.0 pg   MCHC 33.3 30.0 - 36.0 g/dL   RDW 40.9 81.1 - 91.4 %   Platelets 290 150 - 400 K/uL   nRBC 0.0 0.0 - 0.2 %  hCG, quantitative, pregnancy     Status: Abnormal   Collection Time: 07/06/18 11:54 PM  Result Value Ref Range   hCG, Beta Chain, Quant, S 15,481 (H) <5 mIU/mL       IMAGING US Ob Transvaginal  Result Date: 07/07/2018 CLINICAL DATA:  Pelvic pain, positive urine pregnancy test EXAM: TRANSVAGINAL OB ULTRASOUND TECHNIQUE: Transvaginal ultrasound was performed for complete  evaluation of the gestation as well as the maternal uterus, adnexal regions, and pelvic cul-de-sac. COMPARISON:  Pelvic ultrasound 03/27/2018 FINDINGS: Intrauterine gestational sac: Single Yolk sac:  Visualized. Embryo:  Visualized. Cardiac Activity: Visualized. Heart Rate: 100 bpm CRL: 1.8 mm, gestational age and EDC not given due to very small size of the fetal pole (out of range) Subchorionic hemorrhage:  Small subchorionic hemorrhage Maternal  uterus/adnexae: Right ovary is within normal limits and measures 2.1 x 2.9 x 1.7 cm. Left ovary measures 3 point 9 x 5.2 x 3.7 cm and contains 2 anechoic cysts, measuring up to 3.3 cm in size. Trace free fluid. IMPRESSION: Single viable intrauterine pregnancy as above. Small subchorionic hemorrhage and trace free fluid in the pelvis Electronically Signed   By: Jasmine Pang M.D.   On: 07/07/2018 00:48    MAU Management/MDM: Ordered usual first trimester r/o ectopic labs.   Pelvic exam and cultures done Will check baseline Ultrasound to rule out ectopic.  This bleeding/pain can represent a normal pregnancy with bleeding, spontaneous abortion or even an ectopic which can be life-threatening.  The process as listed above helps to determine which of these is present.  Reviewed findings.  Even though fetal pole is only 1.25mm, they were able to see a heartbeat.  There is a small SCH.  Pt has not had any bleeding.    ASSESSMENT 1. Pelvic pain affecting pregnancy   2. Pregnancy of unknown anatomic location     PLAN Discharge home Encouraged her to start prenatal care Bleeding precautions  Follow-up Information    [facility] Follow up.   Specialty:  Obstetrics and Gynecology Contact information: [address] [place] [address] [phone]         Pt stable at time of discharge. Encouraged to return here or to other Urgent Care/ED if she develops worsening of symptoms, increase in pain,  fever, or other concerning symptoms.    Wynelle Bourgeois CNM, MSN Certified Nurse-Midwife 07/07/2018  3:45 AM

## 2018-07-07 ENCOUNTER — Inpatient Hospital Stay (HOSPITAL_COMMUNITY): Payer: Medicaid Other

## 2018-07-07 DIAGNOSIS — O208 Other hemorrhage in early pregnancy: Secondary | ICD-10-CM | POA: Diagnosis not present

## 2018-07-07 DIAGNOSIS — R102 Pelvic and perineal pain: Secondary | ICD-10-CM | POA: Diagnosis not present

## 2018-07-07 DIAGNOSIS — Z3A00 Weeks of gestation of pregnancy not specified: Secondary | ICD-10-CM | POA: Diagnosis not present

## 2018-07-07 LAB — URINALYSIS, ROUTINE W REFLEX MICROSCOPIC
Bilirubin Urine: NEGATIVE
Glucose, UA: NEGATIVE mg/dL
Hgb urine dipstick: NEGATIVE
Ketones, ur: NEGATIVE mg/dL
Leukocytes,Ua: NEGATIVE
Nitrite: NEGATIVE
Protein, ur: NEGATIVE mg/dL
Specific Gravity, Urine: 1.024 (ref 1.005–1.030)
pH: 6 (ref 5.0–8.0)

## 2018-07-07 LAB — CBC
HCT: 37.8 % (ref 36.0–46.0)
Hemoglobin: 12.6 g/dL (ref 12.0–15.0)
MCH: 27.6 pg (ref 26.0–34.0)
MCHC: 33.3 g/dL (ref 30.0–36.0)
MCV: 82.7 fL (ref 80.0–100.0)
Platelets: 290 10*3/uL (ref 150–400)
RBC: 4.57 MIL/uL (ref 3.87–5.11)
RDW: 12.8 % (ref 11.5–15.5)
WBC: 10.8 10*3/uL — ABNORMAL HIGH (ref 4.0–10.5)
nRBC: 0 % (ref 0.0–0.2)

## 2018-07-07 LAB — HIV ANTIBODY (ROUTINE TESTING W REFLEX): HIV Screen 4th Generation wRfx: NONREACTIVE

## 2018-07-07 LAB — WET PREP, GENITAL
Clue Cells Wet Prep HPF POC: NONE SEEN
Sperm: NONE SEEN
Trich, Wet Prep: NONE SEEN
Yeast Wet Prep HPF POC: NONE SEEN

## 2018-07-07 LAB — GC/CHLAMYDIA PROBE AMP (~~LOC~~) NOT AT ARMC
Chlamydia: NEGATIVE
Neisseria Gonorrhea: NEGATIVE

## 2018-07-07 LAB — HCG, QUANTITATIVE, PREGNANCY: hCG, Beta Chain, Quant, S: 15481 m[IU]/mL — ABNORMAL HIGH (ref <5)

## 2018-07-07 NOTE — Discharge Instructions (Signed)
First Trimester of Pregnancy  The first trimester of pregnancy is from week 1 until the end of week 13 (months 1 through 3). During this time, your baby will begin to develop inside you. At 6-8 weeks, the eyes and face are formed, and the heartbeat can be seen on ultrasound. At the end of 12 weeks, all the baby's organs are formed. Prenatal care is all the medical care you receive before the birth of your baby. Make sure you get good prenatal care and follow all of your doctor's instructions. Follow these instructions at home: Medicines  Take over-the-counter and prescription medicines only as told by your doctor. Some medicines are safe and some medicines are not safe during pregnancy.  Take a prenatal vitamin that contains at least 600 micrograms (mcg) of folic acid.  If you have trouble pooping (constipation), take medicine that will make your stool soft (stool softener) if your doctor approves. Eating and drinking   Eat regular, healthy meals.  Your doctor will tell you the amount of weight gain that is right for you.  Avoid raw meat and uncooked cheese.  If you feel sick to your stomach (nauseous) or throw up (vomit): ? Eat 4 or 5 small meals a day instead of 3 large meals. ? Try eating a few soda crackers. ? Drink liquids between meals instead of during meals.  To prevent constipation: ? Eat foods that are high in fiber, like fresh fruits and vegetables, whole grains, and beans. ? Drink enough fluids to keep your pee (urine) clear or pale yellow. Activity  Exercise only as told by your doctor. Stop exercising if you have cramps or pain in your lower belly (abdomen) or low back.  Do not exercise if it is too hot, too humid, or if you are in a place of great height (high altitude).  Try to avoid standing for long periods of time. Move your legs often if you must stand in one place for a long time.  Avoid heavy lifting.  Wear low-heeled shoes. Sit and stand up straight.   You can have sex unless your doctor tells you not to. Relieving pain and discomfort  Wear a good support bra if your breasts are sore.  Take warm water baths (sitz baths) to soothe pain or discomfort caused by hemorrhoids. Use hemorrhoid cream if your doctor says it is okay.  Rest with your legs raised if you have leg cramps or low back pain.  If you have puffy, bulging veins (varicose veins) in your legs: ? Wear support hose or compression stockings as told by your doctor. ? Raise (elevate) your feet for 15 minutes, 3-4 times a day. ? Limit salt in your food. Prenatal care  Schedule your prenatal visits by the twelfth week of pregnancy.  Write down your questions. Take them to your prenatal visits.  Keep all your prenatal visits as told by your doctor. This is important. Safety  Wear your seat belt at all times when driving.  Make a list of emergency phone numbers. The list should include numbers for family, friends, the hospital, and police and fire departments. General instructions  Ask your doctor for a referral to a local prenatal class. Begin classes no later than at the start of month 6 of your pregnancy.  Ask for help if you need counseling or if you need help with nutrition. Your doctor can give you advice or tell you where to go for help.  Do not use hot tubs, steam   rooms, or saunas.  Do not douche or use tampons or scented sanitary pads.  Do not cross your legs for long periods of time.  Avoid all herbs and alcohol. Avoid drugs that are not approved by your doctor.  Do not use any tobacco products, including cigarettes, chewing tobacco, and electronic cigarettes. If you need help quitting, ask your doctor. You may get counseling or other support to help you quit.  Avoid cat litter boxes and soil used by cats. These carry germs that can cause birth defects in the baby and can cause a loss of your baby (miscarriage) or stillbirth.  Visit your dentist. At home,  brush your teeth with a soft toothbrush. Be gentle when you floss. Contact a doctor if:  You are dizzy.  You have mild cramps or pressure in your lower belly.  You have a nagging pain in your belly area.  You continue to feel sick to your stomach, you throw up, or you have watery poop (diarrhea).  You have a bad smelling fluid coming from your vagina.  You have pain when you pee (urinate).  You have increased puffiness (swelling) in your face, hands, legs, or ankles. Get help right away if:  You have a fever.  You are leaking fluid from your vagina.  You have spotting or bleeding from your vagina.  You have very bad belly cramping or pain.  You gain or lose weight rapidly.  You throw up blood. It may look like coffee grounds.  You are around people who have German measles, fifth disease, or chickenpox.  You have a very bad headache.  You have shortness of breath.  You have any kind of trauma, such as from a fall or a car accident. Summary  The first trimester of pregnancy is from week 1 until the end of week 13 (months 1 through 3).  To take care of yourself and your unborn baby, you will need to eat healthy meals, take medicines only if your doctor tells you to do so, and do activities that are safe for you and your baby.  Keep all follow-up visits as told by your doctor. This is important as your doctor will have to ensure that your baby is healthy and growing well. This information is not intended to replace advice given to you by your health care provider. Make sure you discuss any questions you have with your health care provider. Document Released: 07/14/2007 Document Revised: 02/03/2016 Document Reviewed: 02/03/2016 Elsevier Interactive Patient Education  2019 [employer]  

## 2018-07-07 NOTE — Progress Notes (Signed)
Marie Williams CNM in to discuss test results and d/c plan. Written and verbal d/c instructions given and understanding voiced.  

## 2018-08-14 ENCOUNTER — Other Ambulatory Visit: Payer: Self-pay

## 2018-08-14 ENCOUNTER — Ambulatory Visit (INDEPENDENT_AMBULATORY_CARE_PROVIDER_SITE_OTHER): Payer: Medicaid Other | Admitting: Obstetrics & Gynecology

## 2018-08-14 ENCOUNTER — Encounter: Payer: Self-pay | Admitting: Obstetrics & Gynecology

## 2018-08-14 ENCOUNTER — Other Ambulatory Visit (HOSPITAL_COMMUNITY)
Admission: RE | Admit: 2018-08-14 | Discharge: 2018-08-14 | Disposition: A | Discharge status: Home / Self Care | Payer: Medicaid Other | Source: Ambulatory Visit | Attending: Obstetrics & Gynecology | Admitting: Obstetrics & Gynecology

## 2018-08-14 VITALS — BP 105/68 | HR 76 | Temp 97.9°F | Wt 164.8 lb

## 2018-08-14 DIAGNOSIS — O219 Vomiting of pregnancy, unspecified: Secondary | ICD-10-CM

## 2018-08-14 DIAGNOSIS — Z3481 Encounter for supervision of other normal pregnancy, first trimester: Secondary | ICD-10-CM | POA: Diagnosis not present

## 2018-08-14 DIAGNOSIS — Z3480 Encounter for supervision of other normal pregnancy, unspecified trimester: Secondary | ICD-10-CM | POA: Insufficient documentation

## 2018-08-14 DIAGNOSIS — O23591 Infection of other part of genital tract in pregnancy, first trimester: Secondary | ICD-10-CM

## 2018-08-14 DIAGNOSIS — Z3689 Encounter for other specified antenatal screening: Secondary | ICD-10-CM

## 2018-08-14 DIAGNOSIS — B373 Candidiasis of vulva and vagina: Secondary | ICD-10-CM

## 2018-08-14 DIAGNOSIS — Z3A11 11 weeks gestation of pregnancy: Secondary | ICD-10-CM

## 2018-08-14 DIAGNOSIS — N760 Acute vaginitis: Secondary | ICD-10-CM

## 2018-08-14 DIAGNOSIS — B3731 Acute candidiasis of vulva and vagina: Secondary | ICD-10-CM

## 2018-08-14 MED ORDER — BLOOD PRESSURE MONITOR KIT: 1.0000 | PACK | 0 refills | Status: DC

## 2018-08-14 MED ORDER — DOXYLAMINE-PYRIDOXINE 10-10 MG PO TBEC: 2.0000 | DELAYED_RELEASE_TABLET | Freq: Every day | ORAL | 5 refills | Status: DC

## 2018-08-14 NOTE — Progress Notes (Signed)
NOB.  Reports no problems today. 

## 2018-08-14 NOTE — Progress Notes (Signed)
History:   Sue Cruz is a 28 y.o. G3P2002 at 52w0dby LMP, early ultrasound being seen today for her first obstetrical visit.  Her obstetrical history is significant for two term vaginal deliveries, pelvis tested to 8 lb 14 oz. Patient does intend to breast feed. Pregnancy history fully reviewed.  Patient reports nausea and vomiting and wants medication to help.      HISTORY: OB History  Gravida Para Term Preterm AB Living  _0 0 0 2  SAB TAB Ectopic Multiple Live Births  0 0 0 0 2    # Outcome Date GA Lbr Len/2nd Weight Sex Delivery Anes PTL Lv  3 Current           2 Term 12/30/14 443w1d8 lb 13.6 oz (4.015 kg) F Vag-Spont None  LIV     Birth Comments: none     Name: Sue Cruz   Apgar1: 9  Apgar5: 9  1 Term 01/07/08     Vag-Spont   LIV    Last pap smear was done 08/16/2017 and was normal  Past Medical History:  Diagnosis Date  . Medical history non-contributory    Past Surgical History:  Procedure Laterality Date  . WISDOM TOOTH EXTRACTION     History reviewed. No pertinent family history. Social History   Tobacco Use  . Smoking status: Never Smoker  . Smokeless tobacco: Never Used  Substance Use Topics  . Alcohol use: No  . Drug use: No   No Known Allergies Current Outpatient Medications on File Prior to Visit  Medication Sig Dispense Refill  . acetaminophen (TYLENOL) 500 MG tablet Take 500 mg by mouth every 6 (six) hours as needed.    . fluticasone (FLONASE) 50 MCG/ACT nasal spray Place 2 sprays into both nostrils daily. (Patient not taking: Reported on 03/21/2018) 16 g 0  . guaiFENesin (MUCINEX) 600 MG 12 hr tablet Take 1 tablet (600 mg total) by mouth 2 (two) times daily. (Patient not taking: Reported on 03/21/2018) 6 tablet 0  . metroNIDAZOLE (FLAGYL) 500 MG tablet Take 1 tablet (500 mg total) by mouth 2 (two) times daily. (Patient not taking: Reported on 11/14/2017) 14 tablet 0  . Prenatal Vit-Fe Fumarate-FA (PRENATAL MULTIVITAMIN) TABS tablet  Take 1 tablet by mouth daily at 12 noon.     No current facility-administered medications on file prior to visit.     Review of Systems Pertinent items noted in HPI and remainder of comprehensive ROS otherwise negative. Physical Exam:   Vitals:   08/14/18 0912  BP: 105/68  Pulse: 76  Temp: 97.9 F (36.6 C)  Weight: 164 lb 12.8 oz (74.8 kg)   Fetal Heart Rate (bpm): 154 using Bedside Ultrasound for FHR check: Patient informed that the ultrasound is considered a limited obstetric ultrasound and is not intended to be a complete ultrasound exam.  Patient also informed that the ultrasound is not being completed with the intent of assessing for fetal or placental anomalies or any pelvic abnormalities.  Explained that the purpose of today's ultrasound is to assess for fetal heart rate.  Patient acknowledges the purpose of the exam and the limitations of the study.    Pelvic Exam: Perineum: no hemorrhoids, normal perineum   Vulva: normal external genitalia, no lesions   Vagina:  normal mucosa, normal discharge  System: General: well-developed, well-nourished female in no acute distress   Breasts:  normal appearance, no masses or tenderness bilaterally   Skin: normal coloration  and turgor, no rashes   Neurologic: oriented, normal, negative, normal mood   Extremities: normal strength, tone, and muscle mass, ROM of all joints is normal   HEENT PERRLA, extraocular movement intact and sclera clear, anicteric   Mouth/Teeth mucous membranes moist, pharynx normal without lesions and dental hygiene good   Neck supple and no masses   Cardiovascular: regular rate and rhythm   Respiratory:  no respiratory distress, normal breath sounds   Abdomen: soft, non-tender; bowel sounds normal; no masses,  no organomegaly     Assessment:    Pregnancy: [id number] Patient Active Problem List   Diagnosis Date Noted  . Supervision of other normal pregnancy, antepartum 08/14/2018  . Nausea and vomiting during  pregnancy 08/14/2018     Plan:    1. Nausea and vomiting during pregnancy Diclegis prescribed. Will monitor response. - Doxylamine-Pyridoxine (DICLEGIS) 10-10 MG TBEC; Take 2 tablets by mouth at bedtime. If symptoms persist, add one tablet in the morning and one in the afternoon  Dispense: 100 tablet; Refill: 5  2. Encounter for fetal anatomic survey - [place] MFM OB COMP + 14 WK; Future  3. Supervision of other normal pregnancy, antepartum - Enroll Patient in Babyscripts - Babyscripts Schedule Optimization - Cervicovaginal ancillary only( [place]) - Genetic Screening - Obstetric Panel, Including HIV - Culture, OB Urine - Blood Pressure Monitor KIT; 1 kit by Does not apply route once a week. Check BP Weekly.  Large Cuff DX: Z13.6        Z34.86  Dispense: 1 kit; Refill: 0 - Hemoglobin A1c - TSH - Comprehensive metabolic panel - [place] MFM OB COMP + 14 WK; Future  Initial labs drawn. Continue prenatal vitamins. Genetic Screening discussed, NIPS: ordered. Ultrasound discussed; fetal anatomic survey: ordered. Problem list reviewed and updated. Encouraged patient to call ahead if experiencing respiratory symptoms with fever so that she can be triaged and directed appropriately if testing is warranted, given current COVID pandemic. The nature of [place] with multiple MDs and other Advanced Practice Providers was explained to patient; also emphasized that residents, students are part of our team. Routine obstetric precautions reviewed. Return in about 4 weeks (around 09/11/2018) for Virtual OB Visit.     Verita Schneiders, MD, [place] for [employer], [place]

## 2018-08-14 NOTE — Patient Instructions (Addendum)
First Trimester of Pregnancy The first trimester of pregnancy is from week 1 until the end of week 13 (months 1 through 3). A week after a sperm fertilizes an egg, the egg will implant on the wall of the uterus. This embryo will begin to develop into a baby. Genes from you and your partner will form the baby. The female genes will determine whether the baby will be a boy or a girl. At 6-8 weeks, the eyes and face will be formed, and the heartbeat can be seen on ultrasound. At the end of 12 weeks, all the baby's organs will be formed. Now that you are pregnant, you will want to do everything you can to have a healthy baby. Two of the most important things are to get good prenatal care and to follow your health care provider's instructions. Prenatal care is all the medical care you receive before the baby's birth. This care will help prevent, find, and treat any problems during the pregnancy and childbirth. Body changes during your first trimester Your body goes through many changes during pregnancy. The changes vary from woman to woman.  You may gain or lose a couple of pounds at first.  You may feel sick to your stomach (nauseous) and you may throw up (vomit). If the vomiting is uncontrollable, call your health care provider.  You may tire easily.  You may develop headaches that can be relieved by medicines. All medicines should be approved by your health care provider.  You may urinate more often. Painful urination may mean you have a bladder infection.  You may develop heartburn as a result of your pregnancy.  You may develop constipation because certain hormones are causing the muscles that push stool through your intestines to slow down.  You may develop hemorrhoids or swollen veins (varicose veins).  Your breasts may begin to grow larger and become tender. Your nipples may stick out more, and the tissue that surrounds them (areola) may become darker.  Your gums may bleed and may be  sensitive to brushing and flossing.  Dark spots or blotches (chloasma, mask of pregnancy) may develop on your face. This will likely fade after the baby is born.  Your menstrual periods will stop.  You may have a loss of appetite.  You may develop cravings for certain kinds of food.  You may have changes in your emotions from day to day, such as being excited to be pregnant or being concerned that something may go wrong with the pregnancy and baby.  You may have more vivid and strange dreams.  You may have changes in your hair. These can include thickening of your hair, rapid growth, and changes in texture. Some women also have hair loss during or after pregnancy, or hair that feels dry or thin. Your hair will most likely return to normal after your baby is born. What to expect at prenatal visits During a routine prenatal visit:  You will be weighed to make sure you and the baby are growing normally.  Your blood pressure will be taken.  Your abdomen will be measured to track your baby's growth.  The fetal heartbeat will be listened to between weeks 10 and 14 of your pregnancy.  Test results from any previous visits will be discussed. Your health care provider may ask you:  How you are feeling.  If you are feeling the baby move.  If you have had any abnormal symptoms, such as leaking fluid, bleeding, severe headaches, or abdominal   cramping.  If you are using any tobacco products, including cigarettes, chewing tobacco, and electronic cigarettes.  If you have any questions. Other tests that may be performed during your first trimester include:  Blood tests to find your blood type and to check for the presence of any previous infections. The tests will also be used to check for low iron levels (anemia) and protein on red blood cells (Rh antibodies). Depending on your risk factors, or if you previously had diabetes during pregnancy, you may have tests to check for high blood sugar  that affects pregnant women (gestational diabetes).  Urine tests to check for infections, diabetes, or protein in the urine.  An ultrasound to confirm the proper growth and development of the baby.  Fetal screens for spinal cord problems (spina bifida) and Down syndrome.  HIV (human immunodeficiency virus) testing. Routine prenatal testing includes screening for HIV, unless you choose not to have this test.  You may need other tests to make sure you and the baby are doing well. Follow these instructions at home: Medicines  Follow your health care provider's instructions regarding medicine use. Specific medicines may be either safe or unsafe to take during pregnancy.  Take a prenatal vitamin that contains at least 600 micrograms (mcg) of folic acid.  If you develop constipation, try taking a stool softener if your health care provider approves. Eating and drinking   Eat a balanced diet that includes fresh fruits and vegetables, whole grains, good sources of protein such as meat, eggs, or tofu, and low-fat dairy. Your health care provider will help you determine the amount of weight gain that is right for you.  Avoid raw meat and uncooked cheese. These carry germs that can cause birth defects in the baby.  Eating four or five small meals rather than three large meals a day may help relieve nausea and vomiting. If you start to feel nauseous, eating a few soda crackers can be helpful. Drinking liquids between meals, instead of during meals, also seems to help ease nausea and vomiting.  Limit foods that are high in fat and processed sugars, such as fried and sweet foods.  To prevent constipation: ? Eat foods that are high in fiber, such as fresh fruits and vegetables, whole grains, and beans. ? Drink enough fluid to keep your urine clear or pale yellow. Activity  Exercise only as directed by your health care provider. Most women can continue their usual exercise routine during  pregnancy. Try to exercise for 30 minutes at least 5 days a week. Exercising will help you: ? Control your weight. ? Stay in shape. ? Be prepared for labor and delivery.  Experiencing pain or cramping in the lower abdomen or lower back is a good sign that you should stop exercising. Check with your health care provider before continuing with normal exercises.  Try to avoid standing for long periods of time. Move your legs often if you must stand in one place for a long time.  Avoid heavy lifting.  Wear low-heeled shoes and practice good posture.  You may continue to have sex unless your health care provider tells you not to. Relieving pain and discomfort  Wear a good support bra to relieve breast tenderness.  Take warm sitz baths to soothe any pain or discomfort caused by hemorrhoids. Use hemorrhoid cream if your health care provider approves.  Rest with your legs elevated if you have leg cramps or low back pain.  If you develop varicose veins in   your legs, wear support hose. Elevate your feet for 15 minutes, 3-4 times a day. Limit salt in your diet. Prenatal care  Schedule your prenatal visits by the twelfth week of pregnancy. They are usually scheduled monthly at first, then more often in the last 2 months before delivery.  Write down your questions. Take them to your prenatal visits.  Keep all your prenatal visits as told by your health care provider. This is important. Safety  Wear your seat belt at all times when driving.  Make a list of emergency phone numbers, including numbers for family, friends, the hospital, and police and fire departments. General instructions  Ask your health care provider for a referral to a local prenatal education class. Begin classes no later than the beginning of month 6 of your pregnancy.  Ask for help if you have counseling or nutritional needs during pregnancy. Your health care provider can offer advice or refer you to specialists for help  with various needs.  Do not use hot tubs, steam rooms, or saunas.  Do not douche or use tampons or scented sanitary pads.  Do not cross your legs for long periods of time.  Avoid cat litter boxes and soil used by cats. These carry germs that can cause birth defects in the baby and possibly loss of the fetus by miscarriage or stillbirth.  Avoid all smoking, herbs, alcohol, and medicines not prescribed by your health care provider. Chemicals in these products affect the formation and growth of the baby.  Do not use any products that contain nicotine or tobacco, such as cigarettes and e-cigarettes. If you need help quitting, ask your health care provider. You may receive counseling support and other resources to help you quit.  Schedule a dentist appointment. At home, brush your teeth with a soft toothbrush and be gentle when you floss. Contact a health care provider if:  You have dizziness.  You have mild pelvic cramps, pelvic pressure, or nagging pain in the abdominal area.  You have persistent nausea, vomiting, or diarrhea.  You have a bad smelling vaginal discharge.  You have pain when you urinate.  You notice increased swelling in your face, hands, legs, or ankles.  You are exposed to fifth disease or chickenpox.  You are exposed to German measles (rubella) and have never had it. Get help right away if:  You have a fever.  You are leaking fluid from your vagina.  You have spotting or bleeding from your vagina.  You have severe abdominal cramping or pain.  You have rapid weight gain or loss.  You vomit blood or material that looks like coffee grounds.  You develop a severe headache.  You have shortness of breath.  You have any kind of trauma, such as from a fall or a car accident. Summary  The first trimester of pregnancy is from week 1 until the end of week 13 (months 1 through 3).  Your body goes through many changes during pregnancy. The changes vary from  woman to woman.  You will have routine prenatal visits. During those visits, your health care provider will examine you, discuss any test results you may have, and talk with you about how you are feeling. This information is not intended to replace advice given to you by your health care provider. Make sure you discuss any questions you have with your health care provider. Document Released: 01/19/2001 Document Revised: 01/07/2017 Document Reviewed: 01/07/2016 Elsevier Patient Education  2020 [employer] Morning Sickness    Sickness  Morning sickness is when a woman feels nauseous during pregnancy. This nauseous feeling may or may not come with vomiting. It often occurs in the morning, but it can be a problem at any time of day. Morning sickness is most common during the first trimester. In some cases, it may continue throughout pregnancy. Although morning sickness is unpleasant, it is usually harmless unless the woman develops severe and continual vomiting (hyperemesis gravidarum), a condition that requires more intense treatment. What are the causes? The exact cause of this condition is not known, but it seems to be related to normal hormonal changes that occur in pregnancy. What increases the risk? You are more likely to develop this condition if:  You experienced nausea or vomiting before your pregnancy.  You had morning sickness during a previous pregnancy.  You are pregnant with more than one baby, such as twins. What are the signs or symptoms? Symptoms of this condition include:  Nausea.  Vomiting. How is this diagnosed? This condition is usually diagnosed based on your signs and symptoms. How is this treated? In many cases, treatment is not needed for this condition. Making some changes to what you eat may help to control symptoms. Your health care provider may also prescribe or recommend:  Vitamin B6 supplements.  Anti-nausea medicines.  Ginger. Follow these instructions at  home: Medicines  Take over-the-counter and prescription medicines only as told by your health care provider. Do not use any prescription, over-the-counter, or herbal medicines for morning sickness without first talking with your health care provider.  Taking multivitamins before getting pregnant can prevent or decrease the severity of morning sickness in most women. Eating and drinking  Eat a piece of dry toast or crackers before getting out of bed in the morning.  Eat 5 or 6 small meals a day.  Eat dry and bland foods, such as rice or a baked potato. Foods that are high in carbohydrates are often helpful.  Avoid greasy, fatty, and spicy foods.  Have someone cook for you if the smell of any food causes nausea and vomiting.  If you feel nauseous after taking prenatal vitamins, take the vitamins at night or with a snack.  Snack on protein foods between meals if you are hungry. Nuts, yogurt, and cheese are good options.  Drink fluids throughout the day.  Try ginger ale made with real ginger, ginger tea made from fresh grated ginger, or ginger candies. General instructions  Do not use any products that contain nicotine or tobacco, such as cigarettes and e-cigarettes. If you need help quitting, ask your health care provider.  Get an air purifier to keep the air in your house free of odors.  Get plenty of fresh air.  Try to avoid odors that trigger your nausea.  Consider trying these methods to help relieve symptoms: ? Wearing an acupressure wristband. These wristbands are often worn for seasickness. ? Acupuncture. Contact a health care provider if:  Your home remedies are not working and you need medicine.  You feel dizzy or light-headed.  You are losing weight. Get help right away if:  You have persistent and uncontrolled nausea and vomiting.  You faint.  You have severe pain in your abdomen. Summary  Morning sickness is when a woman feels nauseous during pregnancy.  This nauseous feeling may or may not come with vomiting.  Morning sickness is most common during the first trimester.  It often occurs in the morning, but it can be a problem at  of day.  In many cases, treatment is not needed for this condition. Making some changes to what you eat may help to control symptoms. This information is not intended to replace advice given to you by your health care provider. Make sure you discuss any questions you have with your health care provider. Document Released: 03/18/2006 Document Revised: 01/07/2017 Document Reviewed: 02/28/2016 Elsevier Patient Education  2020 Elsevier Inc[employer]  

## 2018-08-15 LAB — OBSTETRIC PANEL, INCLUDING HIV
Antibody Screen: NEGATIVE
Basophils Absolute: 0 10*3/uL (ref 0.0–0.2)
Basos: 0 %
EOS (ABSOLUTE): 0.2 10*3/uL (ref 0.0–0.4)
Eos: 2 %
HIV Screen 4th Generation wRfx: NONREACTIVE
Hematocrit: 38.2 % (ref 34.0–46.6)
Hemoglobin: 12.4 g/dL (ref 11.1–15.9)
Hepatitis B Surface Ag: NEGATIVE
Immature Grans (Abs): 0 10*3/uL (ref 0.0–0.1)
Immature Granulocytes: 0 %
Lymphocytes Absolute: 1.8 10*3/uL (ref 0.7–3.1)
Lymphs: 19 %
MCH: 27.4 pg (ref 26.6–33.0)
MCHC: 32.5 g/dL (ref 31.5–35.7)
MCV: 84 fL (ref 79–97)
Monocytes Absolute: 0.6 10*3/uL (ref 0.1–0.9)
Monocytes: 7 %
Neutrophils Absolute: 6.9 10*3/uL (ref 1.4–7.0)
Neutrophils: 72 %
Platelets: 293 10*3/uL (ref 150–450)
RBC: 4.53 x10E6/uL (ref 3.77–5.28)
RDW: 13.1 % (ref 11.7–15.4)
RPR Ser Ql: NONREACTIVE
Rh Factor: POSITIVE
Rubella Antibodies, IGG: 1.67 index (ref >0.99)
WBC: 9.6 10*3/uL (ref 3.4–10.8)

## 2018-08-15 LAB — COMPREHENSIVE METABOLIC PANEL
ALT: 10 IU/L (ref 0–32)
AST: 14 IU/L (ref 0–40)
Albumin/Globulin Ratio: 1.7 (ref 1.2–2.2)
Albumin: 4.2 g/dL (ref 3.9–5.0)
Alkaline Phosphatase: 54 IU/L (ref 39–117)
BUN/Creatinine Ratio: 11 (ref 9–23)
BUN: 6 mg/dL (ref 6–20)
Bilirubin Total: 0.4 mg/dL (ref 0.0–1.2)
CO2: 21 mmol/L (ref 20–29)
Calcium: 9.4 mg/dL (ref 8.7–10.2)
Chloride: 100 mmol/L (ref 96–106)
Creatinine, Ser: 0.54 mg/dL — ABNORMAL LOW (ref 0.57–1.00)
GFR calc Af Amer: 149 mL/min/{1.73_m2} (ref >59)
GFR calc non Af Amer: 129 mL/min/{1.73_m2} (ref >59)
Globulin, Total: 2.5 g/dL (ref 1.5–4.5)
Glucose: 85 mg/dL (ref 65–99)
Potassium: 4 mmol/L (ref 3.5–5.2)
Sodium: 138 mmol/L (ref 134–144)
Total Protein: 6.7 g/dL (ref 6.0–8.5)

## 2018-08-15 LAB — HEMOGLOBIN A1C
Est. average glucose Bld gHb Est-mCnc: 105 mg/dL
Hgb A1c MFr Bld: 5.3 % (ref 4.8–5.6)

## 2018-08-15 LAB — TSH: TSH: 0.858 u[IU]/mL (ref 0.450–4.500)

## 2018-08-16 LAB — CERVICOVAGINAL ANCILLARY ONLY
Bacterial vaginitis: NEGATIVE
Candida vaginitis: POSITIVE — AB
Chlamydia: NEGATIVE
Neisseria Gonorrhea: NEGATIVE
Trichomonas: NEGATIVE

## 2018-08-16 LAB — URINE CULTURE, OB REFLEX

## 2018-08-16 LAB — CULTURE, OB URINE

## 2018-08-16 MED ORDER — TERCONAZOLE 0.8 % VA CREA: 1.0000 | TOPICAL_CREAM | Freq: Every day | VAGINAL | 0 refills | Status: DC

## 2018-08-16 NOTE — Addendum Note (Signed)
Addended by: Verita Schneiders A on: 08/16/2018 12:48 PM   Modules accepted: Orders

## 2018-08-22 ENCOUNTER — Telehealth: Payer: Self-pay | Admitting: *Deleted

## 2018-08-22 NOTE — Telephone Encounter (Signed)
Pt called to office to inquire about genetic screening results. Pt made aware of results, did not want to know gender. Pt made aware that result was released to MyChart so that she may have her family member view gender.  Pt has no other concerns.

## 2018-08-22 NOTE — Telephone Encounter (Signed)
Pt called back to office with her sister on the line to give gender results to.  Pt gave permission for her results to be given, sister was made aware of results.   Pt has no other concern today.

## 2018-08-23 DIAGNOSIS — Z136 Encounter for screening for cardiovascular disorders: Secondary | ICD-10-CM | POA: Diagnosis not present

## 2018-08-23 DIAGNOSIS — Z3480 Encounter for supervision of other normal pregnancy, unspecified trimester: Secondary | ICD-10-CM | POA: Diagnosis not present

## 2018-08-24 ENCOUNTER — Encounter: Payer: Self-pay | Admitting: Obstetrics & Gynecology

## 2018-09-11 ENCOUNTER — Ambulatory Visit (INDEPENDENT_AMBULATORY_CARE_PROVIDER_SITE_OTHER): Payer: Medicaid Other | Admitting: Advanced Practice Midwife

## 2018-09-11 ENCOUNTER — Other Ambulatory Visit: Payer: Self-pay

## 2018-09-11 VITALS — BP 108/67 | HR 84

## 2018-09-11 DIAGNOSIS — Z3480 Encounter for supervision of other normal pregnancy, unspecified trimester: Secondary | ICD-10-CM

## 2018-09-11 DIAGNOSIS — Z3A15 15 weeks gestation of pregnancy: Secondary | ICD-10-CM

## 2018-09-11 DIAGNOSIS — O219 Vomiting of pregnancy, unspecified: Secondary | ICD-10-CM

## 2018-09-11 NOTE — Progress Notes (Signed)
Pt presents for webex visit. Pt identified with 2 patient identifiers. Her bp today is 108/67. Pt has no concerns today. PT DOES NOT WANT TO KNOW SEX OF BABY.

## 2018-09-11 NOTE — Progress Notes (Signed)
   [place] VIRTUAL VIDEO VISIT ENCOUNTER NOTE  Provider location: [facility] for [facility] at [place]   I connected with Benard Rink on 09/11/18 at  1:15 PM EDT by WebEx Video Encounter at home and verified that I am speaking with the correct person using two identifiers.   I discussed the limitations, risks, security and privacy concerns of performing an evaluation and management service virtually and the availability of in person appointments. I also discussed with the patient that there may be a patient responsible charge related to this service. The patient expressed understanding and agreed to proceed. Subjective:  MARKEESHA CHAR is a 28 y.o. G3P2002 at [redacted]w[redacted]d being seen today for ongoing prenatal care.  She is currently monitored for the following issues for this low-risk pregnancy and has Supervision of other normal pregnancy, antepartum and Nausea and vomiting during pregnancy on their problem list.  Patient reports no complaints.  Contractions: Irritability. Vag. Bleeding: None.   . Denies any leaking of fluid.   The following portions of the patient's history were reviewed and updated as appropriate: allergies, current medications, past family history, past medical history, past social history, past surgical history and problem list.   Objective:   Vitals:   09/11/18 1113  BP: 108/67  Pulse: 84    Fetal Status:           General:  Alert, oriented and cooperative. Patient is in no acute distress.  Respiratory: Normal respiratory effort, no problems with respiration noted  Mental Status: Normal mood and affect. Normal behavior. Normal judgment and thought content.  Rest of physical exam deferred due to type of encounter  Imaging: No results found.  Assessment and Plan:  Pregnancy: G3P2002 at [redacted]w[redacted]d 1. Nausea and vomiting during pregnancy --Doing well on Diclegis  2. Supervision of other normal pregnancy, antepartum --Pt reports early fetal  movement, denies cramping, LOF, or vaginal bleeding --Anticipatory guidance about next visits/weeks of pregnancy given. --Next visit in 4 weeks, after anatomy US   Preterm labor symptoms and general obstetric precautions including but not limited to vaginal bleeding, contractions, leaking of fluid and fetal movement were reviewed in detail with the patient. I discussed the assessment and treatment plan with the patient. The patient was provided an opportunity to ask questions and all were answered. The patient agreed with the plan and demonstrated an understanding of the instructions. The patient was advised to call back or seek an in-person office evaluation/go to [facility] at [facility] for any urgent or concerning symptoms. Please refer to After Visit Summary for other counseling recommendations.   I provided 10 minutes of face-to-face time during this encounter.  No follow-ups on file.  Future Appointments  Date Time Provider [place]  10/09/2018 10:15 AM WH-MFC [place] 4 WH-MFCUS MFC-US    Lisa Leftwich-Kirby, [place] for [facility], [place]

## 2018-09-21 ENCOUNTER — Inpatient Hospital Stay (HOSPITAL_COMMUNITY)
Admission: AD | Admit: 2018-09-21 | Discharge: 2018-09-22 | Disposition: A | Discharge status: Home / Self Care | Payer: Medicaid Other | Attending: Obstetrics and Gynecology | Admitting: Obstetrics and Gynecology

## 2018-09-21 ENCOUNTER — Other Ambulatory Visit: Payer: Self-pay

## 2018-09-21 DIAGNOSIS — O99891 Other specified diseases and conditions complicating pregnancy: Secondary | ICD-10-CM

## 2018-09-21 DIAGNOSIS — O26892 Other specified pregnancy related conditions, second trimester: Secondary | ICD-10-CM | POA: Insufficient documentation

## 2018-09-21 DIAGNOSIS — M549 Dorsalgia, unspecified: Secondary | ICD-10-CM

## 2018-09-21 DIAGNOSIS — Z3A16 16 weeks gestation of pregnancy: Secondary | ICD-10-CM | POA: Diagnosis not present

## 2018-09-21 DIAGNOSIS — R82998 Other abnormal findings in urine: Secondary | ICD-10-CM

## 2018-09-21 DIAGNOSIS — M545 Low back pain: Secondary | ICD-10-CM | POA: Insufficient documentation

## 2018-09-21 DIAGNOSIS — O9989 Other specified diseases and conditions complicating pregnancy, childbirth and the puerperium: Secondary | ICD-10-CM | POA: Diagnosis not present

## 2018-09-21 DIAGNOSIS — M62830 Muscle spasm of back: Secondary | ICD-10-CM

## 2018-09-21 DIAGNOSIS — R1030 Lower abdominal pain, unspecified: Secondary | ICD-10-CM | POA: Insufficient documentation

## 2018-09-21 NOTE — MAU Note (Signed)
Reports she has been having abdominal and back pain that gets worse with movement and when she changes position.  The abdominal pain has occurred every night and the back pain started today.  Hasn't taken anything for the pain.  No VB/LOF.

## 2018-09-21 NOTE — MAU Provider Note (Signed)
Chief Complaint:  Abdominal Pain and Back Pain   First Provider Initiated Contact with Patient 09/21/18 2355      HPI: Sue Cruz is a 28 y.o. G3P2002 at 21w3dho presents to maternity admissions reporting pain in lower abdomen and low back.  Hurts more with activity and movement..  . She denies LOF, vaginal bleeding, vaginal itching/burning, urinary symptoms, h/a, dizziness, n/v, diarrhea, constipation or fever/chills.    RN Note: Reports she has been having abdominal and back pain that gets worse with movement and when she changes position.  The abdominal pain has occurred every night and the back pain started today.  Hasn't taken anything for the pain.  No VB/LOF.    Past Medical History: Past Medical History:  Diagnosis Date  . Medical history non-contributory     Past obstetric history: OB History  Gravida Para Term Preterm AB Living  '3 2 2     2  '$ SAB TAB Ectopic Multiple Live Births        0 2    # Outcome Date GA Lbr Len/2nd Weight Sex Delivery Anes PTL Lv  3 Current           2 Term 12/30/14 [id number] g F Vag-Spont None  LIV     Birth Comments: none  1 Term 01/07/08     Vag-Spont   LIV    Past Surgical History: Past Surgical History:  Procedure Laterality Date  . WISDOM TOOTH EXTRACTION      Family History: No family history on file.  Social History: Social History   Tobacco Use  . Smoking status: Never Smoker  . Smokeless tobacco: Never Used  Substance Use Topics  . Alcohol use: No  . Drug use: No    Allergies: No Known Allergies  Meds:  Medications Prior to Admission  Medication Sig Dispense Refill Last Dose  . acetaminophen (TYLENOL) 500 MG tablet Take 500 mg by mouth every 6 (six) hours as needed.     . Blood Pressure Monitor KIT 1 kit by Does not apply route once a week. Check BP Weekly.  Large Cuff  DX: Z13.6        Z34.86 1 kit 0   . Doxylamine-Pyridoxine (DICLEGIS) 10-10 MG TBEC Take 2 tablets by mouth at bedtime. If symptoms persist,  add one tablet in the morning and one in the afternoon 100 tablet 5   . fluticasone (FLONASE) 50 MCG/ACT nasal spray Place 2 sprays into both nostrils daily. (Patient not taking: Reported on 09/11/2018) 16 g 0   . guaiFENesin (MUCINEX) 600 MG 12 hr tablet Take 1 tablet (600 mg total) by mouth 2 (two) times daily. (Patient not taking: Reported on 03/21/2018) 6 tablet 0   . Prenatal Vit-Fe Fumarate-FA (PRENATAL MULTIVITAMIN) TABS tablet Take 1 tablet by mouth daily at 12 noon.       I have reviewed patient's Past Medical Hx, Surgical Hx, Family Hx, Social Hx, medications and allergies.   ROS:  Review of Systems  Constitutional: Negative for chills and fever.  Respiratory: Negative for shortness of breath.   Cardiovascular: Negative for leg swelling.  Gastrointestinal: Positive for abdominal pain. Negative for constipation, diarrhea, nausea and vomiting.  Genitourinary: Positive for pelvic pain. Negative for vaginal bleeding.  Musculoskeletal: Positive for back pain.  Neurological: Negative for dizziness, weakness and numbness.   Other systems negative  Physical Exam   Patient Vitals for the past 24 hrs:  BP Temp Pulse Resp Weight  09/21/18 2347  113/64 98.7 F (37.1 C) 100 19 76.2 kg   Constitutional: Well-developed, well-nourished female in no acute distress.  Cardiovascular: normal rate and rhythm Respiratory: normal effort, clear to auscultation bilaterally GI: Abd soft, non-tender, gravid appropriate for gestational age.   No rebound or guarding. MS: Extremities nontender, no edema, normal ROM Neurologic: Alert and oriented x 4.  GU: Neg CVAT.  PELVIC EXAM: Dilation: Closed Effacement (%): Thick Exam by:: Hansel Feinstein, CNM   FHT:  [phone]   Labs: Results for orders placed or performed during the hospital encounter of 09/21/18 (from the past 24 hour(s))  Urinalysis, Routine w reflex microscopic     Status: Abnormal   Collection Time: 09/21/18 11:43 PM  Result Value Ref Range    Color, Urine YELLOW YELLOW   APPearance HAZY (A) CLEAR   Specific Gravity, Urine 1.011 1.005 - 1.030   pH 7.0 5.0 - 8.0   Glucose, UA NEGATIVE NEGATIVE mg/dL   Hgb urine dipstick NEGATIVE NEGATIVE   Bilirubin Urine NEGATIVE NEGATIVE   Ketones, ur NEGATIVE NEGATIVE mg/dL   Protein, ur NEGATIVE NEGATIVE mg/dL   Nitrite NEGATIVE NEGATIVE   Leukocytes,Ua MODERATE (A) NEGATIVE   RBC / HPF 0-5 0 - 5 RBC/hpf   WBC, UA 0-5 0 - 5 WBC/hpf   Bacteria, UA RARE (A) NONE SEEN   Squamous Epithelial / LPF 0-5 0 - 5    O/Positive/-- (07/06 6986)  Imaging:  No results found.  MAU Course/MDM: I have ordered labs and reviewed results. There are leukocytes in urine, will send to culture but will not treat now.  Flexeril '5mg'$  given to see if there is relief of pain with that >> There was almost complete relief from pain  Will send Rx for this for prn use at home Will treat Urine for infection if culture positive.   Treatments in MAU included Flexeril.    Assessment: Single intrauterine pregnancy at 8w4dLow back pain radiating to lower abdomen, likely spasm Leukocytes in urine  Plan: Discharge home Urine to culture, treat per results Rx Flexeril '5mg'$  prn back spasm  Preterm Labor precautions and fetal kick counts Follow up in Office for prenatal visits and recheck  Encouraged to return here or to other Urgent Care/ED if she develops worsening of symptoms, increase in pain, fever, or other concerning symptoms.   Pt stable at time of discharge.  MHansel FeinsteinCNM, MSN Certified Nurse-Midwife 09/21/2018 11:55 PM

## 2018-09-22 ENCOUNTER — Encounter (HOSPITAL_COMMUNITY): Payer: Self-pay

## 2018-09-22 DIAGNOSIS — M549 Dorsalgia, unspecified: Secondary | ICD-10-CM | POA: Diagnosis not present

## 2018-09-22 DIAGNOSIS — M545 Low back pain: Secondary | ICD-10-CM | POA: Diagnosis not present

## 2018-09-22 DIAGNOSIS — Z3A16 16 weeks gestation of pregnancy: Secondary | ICD-10-CM | POA: Diagnosis not present

## 2018-09-22 DIAGNOSIS — O9989 Other specified diseases and conditions complicating pregnancy, childbirth and the puerperium: Secondary | ICD-10-CM | POA: Diagnosis not present

## 2018-09-22 DIAGNOSIS — R1030 Lower abdominal pain, unspecified: Secondary | ICD-10-CM | POA: Diagnosis present

## 2018-09-22 DIAGNOSIS — O26892 Other specified pregnancy related conditions, second trimester: Secondary | ICD-10-CM | POA: Diagnosis not present

## 2018-09-22 LAB — URINALYSIS, ROUTINE W REFLEX MICROSCOPIC
Bilirubin Urine: NEGATIVE
Glucose, UA: NEGATIVE mg/dL
Hgb urine dipstick: NEGATIVE
Ketones, ur: NEGATIVE mg/dL
Nitrite: NEGATIVE
Protein, ur: NEGATIVE mg/dL
Specific Gravity, Urine: 1.011 (ref 1.005–1.030)
pH: 7 (ref 5.0–8.0)

## 2018-09-22 MED ORDER — CYCLOBENZAPRINE HCL 5 MG PO TABS: 5.0000 mg | ORAL_TABLET | Freq: Three times a day (TID) | ORAL | 0 refills | Status: DC | PRN

## 2018-09-22 MED ORDER — CYCLOBENZAPRINE HCL 10 MG PO TABS
5.0000 mg | ORAL_TABLET | Freq: Once | ORAL | Status: AC
  Administered 2018-09-22: 5 mg via ORAL
  Filled 2018-09-22: qty 1

## 2018-09-22 NOTE — Discharge Instructions (Signed)

## 2018-09-23 LAB — CULTURE, OB URINE: Culture: <10000 — AB

## 2018-10-09 ENCOUNTER — Ambulatory Visit (HOSPITAL_COMMUNITY)
Admission: RE | Admit: 2018-10-09 | Discharge: 2018-10-09 | Disposition: A | Discharge status: Home / Self Care | Payer: Medicaid Other | Source: Ambulatory Visit | Attending: Obstetrics and Gynecology | Admitting: Obstetrics and Gynecology

## 2018-10-09 ENCOUNTER — Other Ambulatory Visit: Payer: Self-pay

## 2018-10-09 DIAGNOSIS — Z3480 Encounter for supervision of other normal pregnancy, unspecified trimester: Secondary | ICD-10-CM | POA: Insufficient documentation

## 2018-10-09 DIAGNOSIS — Z3A19 19 weeks gestation of pregnancy: Secondary | ICD-10-CM

## 2018-10-09 DIAGNOSIS — Z363 Encounter for antenatal screening for malformations: Secondary | ICD-10-CM | POA: Diagnosis not present

## 2018-10-09 DIAGNOSIS — Z3689 Encounter for other specified antenatal screening: Secondary | ICD-10-CM | POA: Diagnosis not present

## 2018-10-10 ENCOUNTER — Ambulatory Visit (INDEPENDENT_AMBULATORY_CARE_PROVIDER_SITE_OTHER): Payer: Medicaid Other | Admitting: Advanced Practice Midwife

## 2018-10-10 DIAGNOSIS — Z3480 Encounter for supervision of other normal pregnancy, unspecified trimester: Secondary | ICD-10-CM

## 2018-10-10 DIAGNOSIS — Z3483 Encounter for supervision of other normal pregnancy, third trimester: Secondary | ICD-10-CM

## 2018-10-10 DIAGNOSIS — Z3A19 19 weeks gestation of pregnancy: Secondary | ICD-10-CM

## 2018-10-10 NOTE — Progress Notes (Signed)
TELEHEALTH OBSTETRICS PRENATAL VIRTUAL VIDEO VISIT ENCOUNTER NOTE  Provider [facility]   I connected with Sue SiasJoanna M Cruz on 10/10/18 at  2:15 PM EDT by WebEx Video Encounter at home and verified that I am speaking with the correct person using two identifiers.   I discussed the limitations, risks, security and privacy concerns of performing an evaluation and management service virtually and the availability of in person appointments. I also discussed with the patient that there may be a patient responsible charge related to this service. The patient expressed understanding and agreed to proceed. Subjective:  Sue Cruz is a 28 y.o. G3P2002 at 6682w1d being seen today for ongoing prenatal care.  She is currently monitored for the following issues for this low-risk pregnancy and has Supervision of other normal pregnancy, antepartum and Nausea and vomiting during pregnancy on their problem list.  Patient reports no complaints.  Contractions: Irritability. Vag. Bleeding: None.  Movement: Absent. Denies any leaking of fluid.   The following portions of the patient's history were reviewed and updated as appropriate: allergies, current medications, past family history, past medical history, past social history, past surgical history and problem list.   Objective:  There were no vitals filed for this visit.  Fetal Status:     Movement: Absent     General:  Alert, oriented and cooperative. Patient is in no acute distress.  Respiratory: Normal respiratory effort, no problems with respiration noted  Mental Status: Normal mood and affect. Normal behavior. Normal judgment and thought content.  Rest of physical exam deferred due to type of encounter  Imaging: Koreas Mfm Ob Comp + 14 Wk  Result Date: 10/10/2018 ----------------------------------------------------------------------  OBSTETRICS REPORT                        (Signed Final 10/10/2018 06:33 am)  ---------------------------------------------------------------------- Patient Info  ID #:       [id number]                          D.O.B.:  09-15-1990 (28 yrs)  Name:       Sue HearingJOANNA M Cruz                  Visit Date: 10/09/2018 10:43 am ---------------------------------------------------------------------- Performed By  Performed By:     Birdena CrandallYasemin Karatas        Secondary Phy.:    Jethro BastosUGONNA A                    RDMS,RVT                                                              Macon LargeANYANWU MD  Attending:        Lin Landsmanorenthian Booker      Address:           [address] [place] [place]                    MD  [address]                                                              [place], [place]                                                              [address]  Referred By:      Elmore Guise                 [facility]          [facility]                              Fetal Care  Ref. Address:     [facility]                    OB/GYN                    [address]                    [address]                    [place]                    [address] ---------------------------------------------------------------------- Orders   #  Description                          Code         Ordered By   1  [place] MFM OB COMP + 14 WK               72820.60     Jaynie Collins  ----------------------------------------------------------------------   #  Order #                    Accession #                 Episode #   1  [id number]                  [id number]                  [id number]  ---------------------------------------------------------------------- Indications   Encounter for antenatal screening for   malformations (Low risk NIPS, negative   carrier screening)   [redacted] weeks gestation of pregnancy  ---------------------------------------------------------------------- Fetal Evaluation  Num Of Fetuses:          1  Fetal Heart Rate(bpm):   144  Cardiac  Activity:        Observed  Presentation:            Cephalic  Placenta:                Posterior  P. Cord Insertion:  Visualized, central  Amniotic Fluid  AFI FV:      Within normal limits                              Largest Pocket(cm)                              6.81 ---------------------------------------------------------------------- Biometry  BPD:      47.2  mm     G. Age:  20w 2d         92  %    CI:        76.32   %    70 - 86                                                          FL/HC:       17.6  %    16.1 - 18.3  HC:      171.2  mm     G. Age:  19w 5d         76  %    HC/AC:       1.14       1.09 - 1.39  AC:      150.6  mm     G. Age:  20w 2d         85  %    FL/BPD:      63.8  %  FL:       30.1  mm     G. Age:  19w 2d         55  %    FL/AC:       20.0  %    20 - 24  CER:        20  mm     G. Age:  19w 0d         50  %  NFT:         4  mm  LV:        5.3  mm  CM:        4.2  mm  Est. FW:     316   gm   0 lb 11 oz      89  % ---------------------------------------------------------------------- OB History  Gravidity:    3         Term:   2  Living:       2 ---------------------------------------------------------------------- Gestational Age  LMP:           19w 0d        Date:  05/29/18                 EDD:   03/05/19  U/S Today:     19w 6d                                        EDD:   02/27/19  Best:          19w 0d     Det. By:  LMP  (05/29/18)  EDD:   03/05/19 ---------------------------------------------------------------------- Anatomy  Cranium:               Appears normal         LVOT:                   Appears normal  Cavum:                 Appears normal         Aortic Arch:            Appears normal  Ventricles:            Appears normal         Ductal Arch:            Appears normal  Choroid Plexus:        Appears normal         Diaphragm:              Appears normal  Cerebellum:            Appears normal         Stomach:                Appears normal, left                                                                         sided  Posterior Fossa:       Appears normal         Abdomen:                Appears normal  Nuchal Fold:           Appears normal         Abdominal Wall:         Appears nml (cord                                                                        insert, abd wall)  Face:                  Appears normal         Cord Vessels:           Appears normal (3                         (orbits and profile)                           vessel cord)  Lips:                  Appears normal         Kidneys:                Appear normal  Palate:                Not well visualized  Bladder:                Appears normal  Thoracic:              Appears normal         Spine:                  Appears normal  Heart:                 Appears normal         Upper Extremities:      Appears normal                         (4CH, axis, and                         situs)  RVOT:                  Appears normal         Lower Extremities:      Appears normal  Other:  Female gender Nasal bone visualized. Heels and 5th digit visualized.          Lenses visualized. ---------------------------------------------------------------------- Cervix Uterus Adnexa  Cervix  Length:            4.5  cm.  Normal appearance by transabdominal scan.  Uterus  No abnormality visualized.  Left Ovary  Within normal limits.  Right Ovary  Within normal limits.  Cul De Sac  No free fluid seen.  Adnexa  No abnormality visualized. ---------------------------------------------------------------------- Impression  Normal interval growth.  No ultrasonic evidence of structural  fetal anomalies.  NIPS low risk  Good fetal movement and amniotic fluid. ---------------------------------------------------------------------- Recommendations  Follow up as clinically indicated ----------------------------------------------------------------------               Sander Nephew, MD Electronically Signed Final Report   10/10/2018 06:33  am ----------------------------------------------------------------------   Assessment and Plan:  Pregnancy: [id number] at [redacted]w[redacted]d 1. Supervision of other normal pregnancy, antepartum --Pt reports she is not yet feeling fetal movement, denies cramping, LOF, or vaginal bleeding --Anticipatory guidance about next visits/weeks of pregnancy given. --Reviewed normal anatomy US done 10/09/18 --Next visit in 4 weeks, virtual  Preterm labor symptoms and general obstetric precautions including but not limited to vaginal bleeding, contractions, leaking of fluid and fetal movement were reviewed in detail with the patient. I discussed the assessment and treatment plan with the patient. The patient was provided an opportunity to ask questions and all were answered. The patient agreed with the plan and demonstrated an understanding of the instructions. The patient was advised to call back or seek an in-person office evaluation/go to [facility]U at [facility] for any urgent or concerning symptoms. Please refer to After Visit Summary for other counseling recommendations.   I provided 10 minutes of face-to-face time during this encounter.  Return in about 4 weeks (around 11/07/2018).  No future appointments.  Fatima Blank, [place] for [employer], [place]

## 2018-11-07 ENCOUNTER — Ambulatory Visit (INDEPENDENT_AMBULATORY_CARE_PROVIDER_SITE_OTHER): Payer: Medicaid Other | Admitting: Obstetrics

## 2018-11-07 ENCOUNTER — Encounter: Payer: Self-pay | Admitting: Obstetrics

## 2018-11-07 DIAGNOSIS — Z3A23 23 weeks gestation of pregnancy: Secondary | ICD-10-CM | POA: Diagnosis not present

## 2018-11-07 DIAGNOSIS — M549 Dorsalgia, unspecified: Secondary | ICD-10-CM

## 2018-11-07 DIAGNOSIS — O99891 Other specified diseases and conditions complicating pregnancy: Secondary | ICD-10-CM

## 2018-11-07 DIAGNOSIS — O9989 Other specified diseases and conditions complicating pregnancy, childbirth and the puerperium: Secondary | ICD-10-CM

## 2018-11-07 DIAGNOSIS — Z3480 Encounter for supervision of other normal pregnancy, unspecified trimester: Secondary | ICD-10-CM

## 2018-11-07 MED ORDER — COMFORT FIT MATERNITY SUPP SM MISC: 0 refills | Status: DC

## 2018-11-07 NOTE — Progress Notes (Signed)
   TELEHEALTH VIRTUAL OBSTETRICS VISIT ENCOUNTER NOTE  I connected with Sue Cruz on 11/07/18 at  2:15 PM EDT by telephone at home and verified that I am speaking with the correct person using two identifiers.   I discussed the limitations, risks, security and privacy concerns of performing an evaluation and management service by telephone and the availability of in person appointments. I also discussed with the patient that there may be a patient responsible charge related to this service. The patient expressed understanding and agreed to proceed.  Subjective:  Sue Cruz is a 28 y.o. G3P2002 at [redacted]w[redacted]d being followed for ongoing prenatal care.  She is currently monitored for the following issues for this low-risk pregnancy and has Supervision of other normal pregnancy, antepartum and Nausea and vomiting during pregnancy on their problem list.  Patient reports backache. Reports fetal movement. Denies any contractions, bleeding or leaking of fluid.   The following portions of the patient's history were reviewed and updated as appropriate: allergies, current medications, past family history, past medical history, past social history, past surgical history and problem list.   Objective:   General:  Alert, oriented and cooperative.   Mental Status: Normal mood and affect perceived. Normal judgment and thought content.  Rest of physical exam deferred due to type of encounter  Assessment and Plan:  Pregnancy: G3P2002 at [redacted]w[redacted]d   1. Supervision of other normal pregnancy, antepartum  2. Back pain affecting pregnancy in second trimester Rx: - Elastic Bandages & Supports (COMFORT FIT MATERNITY SUPP SM) MISC; Wear as directed.  Dispense: 1 each; Refill: 0   Preterm labor symptoms and general obstetric precautions including but not limited to vaginal bleeding, contractions, leaking of fluid and fetal movement were reviewed in detail with the patient.  I discussed the assessment and  treatment plan with the patient. The patient was provided an opportunity to ask questions and all were answered. The patient agreed with the plan and demonstrated an understanding of the instructions. The patient was advised to call back or seek an in-person office evaluation/go to [facility]U at [facility] for any urgent or concerning symptoms. Please refer to After Visit Summary for other counseling recommendations.   I provided 10 minutes of non-face-to-face time during this encounter.  Return in about 4 weeks (around 12/05/2018) for ROB, 2 hour OGTT.   Baltazar Najjar, MD [facility] for [facility], [facility] 11/07/2018

## 2018-11-15 ENCOUNTER — Encounter (HOSPITAL_COMMUNITY): Payer: Self-pay

## 2018-11-15 ENCOUNTER — Ambulatory Visit (HOSPITAL_COMMUNITY)
Admission: EM | Admit: 2018-11-15 | Discharge: 2018-11-15 | Disposition: A | Discharge status: Home / Self Care | Payer: Medicaid Other | Attending: Urgent Care | Admitting: Urgent Care

## 2018-11-15 ENCOUNTER — Other Ambulatory Visit: Payer: Self-pay

## 2018-11-15 DIAGNOSIS — Z3493 Encounter for supervision of normal pregnancy, unspecified, third trimester: Secondary | ICD-10-CM | POA: Diagnosis not present

## 2018-11-15 DIAGNOSIS — R0981 Nasal congestion: Secondary | ICD-10-CM | POA: Diagnosis not present

## 2018-11-15 DIAGNOSIS — B9789 Other viral agents as the cause of diseases classified elsewhere: Secondary | ICD-10-CM | POA: Diagnosis not present

## 2018-11-15 DIAGNOSIS — J019 Acute sinusitis, unspecified: Secondary | ICD-10-CM | POA: Insufficient documentation

## 2018-11-15 DIAGNOSIS — H9202 Otalgia, left ear: Secondary | ICD-10-CM | POA: Insufficient documentation

## 2018-11-15 DIAGNOSIS — Z20828 Contact with and (suspected) exposure to other viral communicable diseases: Secondary | ICD-10-CM | POA: Insufficient documentation

## 2018-11-15 MED ORDER — CETIRIZINE HCL 10 MG PO TABS: 10.0000 mg | ORAL_TABLET | Freq: Every day | ORAL | 0 refills | Status: DC

## 2018-11-15 MED ORDER — PSEUDOEPHEDRINE HCL 60 MG PO TABS: 60.0000 mg | ORAL_TABLET | Freq: Three times a day (TID) | ORAL | 0 refills | Status: DC | PRN

## 2018-11-15 NOTE — ED Provider Notes (Signed)
MRN: [id number] DOB: 1990-07-10  Subjective:   Sue Cruz is a 28 y.o. female presenting for 1 day history of acute onset moderate sore throat with difficulty swallowing, nasal congestion, left ear pain. Has tried Tylenol with some relief. Patient is a Materials engineer. 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 Practices social distancing. Patient is currently 6 months pregnant.   Takes prenatal vitamin.    No Known Allergies  Past Medical History:  Diagnosis Date  . Medical history non-contributory     Past Surgical History:  Procedure Laterality Date  . WISDOM TOOTH EXTRACTION     ROS  Objective:   Vitals: BP 111/66 (BP Location: Left Arm)   Pulse (!) 105   Temp 98 F (36.7 C) (Tympanic)   Resp 18   LMP 05/29/2018   SpO2 99%   Physical Exam Constitutional:      General: She is not in acute distress.    Appearance: Normal appearance. She is well-developed. She is not ill-appearing, toxic-appearing or diaphoretic.  HENT:     Head: Normocephalic and atraumatic.     Right Ear: Tympanic membrane and ear canal normal. No drainage or tenderness. No middle ear effusion. Tympanic membrane is not erythematous.     Left Ear: Tympanic membrane and ear canal normal. No drainage or tenderness.  No middle ear effusion. Tympanic membrane is not erythematous.     Nose: Congestion and rhinorrhea present.     Mouth/Throat:     Mouth: Mucous membranes are moist. No oral lesions.     Pharynx: Oropharynx is clear. No pharyngeal swelling, oropharyngeal exudate, posterior oropharyngeal erythema or uvula swelling.     Tonsils: No tonsillar exudate or tonsillar abscesses.  Eyes:     General: No scleral icterus.       Right eye: No discharge.        Left eye: No discharge.     Extraocular Movements: Extraocular movements intact.     Right eye: Normal extraocular motion.     Left eye: Normal extraocular motion.     Conjunctiva/sclera: Conjunctivae normal.     Pupils: Pupils are equal, round, and reactive  to light.  Neck:     Musculoskeletal: Normal range of motion and neck supple.  Cardiovascular:     Rate and Rhythm: Normal rate and regular rhythm.     Pulses: Normal pulses.     Heart sounds: Normal heart sounds. No murmur. No friction rub. No gallop.   Pulmonary:     Effort: Pulmonary effort is normal. No respiratory distress.     Breath sounds: Normal breath sounds. No stridor. No wheezing, rhonchi or rales.  Lymphadenopathy:     Cervical: No cervical adenopathy.  Skin:    General: Skin is warm and dry.     Findings: No rash.  Neurological:     General: No focal deficit present.     Mental Status: She is alert and oriented to person, place, and time.  Psychiatric:        Mood and Affect: Mood normal.        Behavior: Behavior normal.        Thought Content: Thought content normal.        Judgment: Judgment normal.     Assessment and Plan :   1. Acute viral sinusitis   2. Nasal congestion   3. Left ear pain   4. Third trimester pregnancy     Will manage for viral illness. Counseled patient on nature of COVID-19 including modes  of transmission, diagnostic testing, management and supportive care.  Offered symptomatic relief. COVID 19 testing is pending. Counseled patient on potential for adverse effects with medications prescribed/recommended today, ER and return-to-clinic precautions discussed, patient verbalized understanding.     Wallis Bamberg, PA-C 11/15/18 1103

## 2018-11-15 NOTE — Discharge Instructions (Addendum)
We will manage this as a viral syndrome. For sore throat or cough try using a honey-based tea. Use 3 teaspoons of honey with juice squeezed from half lemon. Place shaved pieces of ginger into 1/2-1 cup of water and warm over stove top. Then mix the ingredients and repeat every 4 hours as needed. Please take Tylenol 500mg  every 6 hours. Hydrate very well with at least 2 liters of water. Eat light meals such as soups to replenish electrolytes and soft fruits, veggies. Start an antihistamine like Zyrtec for postnasal drainage, sinus congestion.  You can take this together with pseudoephedrine (Sudafed) at a dose of 60 mg 3 times a day oral 120 mg twice daily as needed for the same kind of congestion.

## 2018-11-15 NOTE — ED Triage Notes (Signed)
Pt presents to UC w/ c/o sore throat, congestion, ear pain since last night.

## 2018-11-17 LAB — NOVEL CORONAVIRUS, NAA (HOSP ORDER, SEND-OUT TO REF LAB; TAT 18-24 HRS): SARS-CoV-2, NAA: NOT DETECTED

## 2018-12-05 ENCOUNTER — Other Ambulatory Visit: Payer: Medicaid Other

## 2018-12-05 ENCOUNTER — Encounter: Payer: Self-pay | Admitting: Obstetrics and Gynecology

## 2018-12-05 ENCOUNTER — Other Ambulatory Visit: Payer: Self-pay

## 2018-12-05 ENCOUNTER — Ambulatory Visit (INDEPENDENT_AMBULATORY_CARE_PROVIDER_SITE_OTHER): Payer: Medicaid Other | Admitting: Obstetrics and Gynecology

## 2018-12-05 DIAGNOSIS — Z3A27 27 weeks gestation of pregnancy: Secondary | ICD-10-CM

## 2018-12-05 DIAGNOSIS — Z3480 Encounter for supervision of other normal pregnancy, unspecified trimester: Secondary | ICD-10-CM | POA: Diagnosis not present

## 2018-12-05 DIAGNOSIS — Z3482 Encounter for supervision of other normal pregnancy, second trimester: Secondary | ICD-10-CM

## 2018-12-05 DIAGNOSIS — O339 Maternal care for disproportion, unspecified: Secondary | ICD-10-CM | POA: Diagnosis not present

## 2018-12-05 NOTE — Progress Notes (Signed)
ROB  2hr GTT Today.    CC: None pt would like support maternity belt  T- DAP Declined  .

## 2018-12-05 NOTE — Patient Instructions (Signed)
Third Trimester of Pregnancy The third trimester is from week 28 through week 40 (months 7 through 9). The third trimester is a time when the unborn baby (fetus) is growing rapidly. At the end of the ninth month, the fetus is about 20 inches in length and weighs 6-10 pounds. Body changes during your third trimester Your body will continue to go through many changes during pregnancy. The changes vary from woman to woman. During the third trimester:  Your weight will continue to increase. You can expect to gain 25-35 pounds (11-16 kg) by the end of the pregnancy.  You may begin to get stretch marks on your hips, abdomen, and breasts.  You may urinate more often because the fetus is moving lower into your pelvis and pressing on your bladder.  You may develop or continue to have heartburn. This is caused by increased hormones that slow down muscles in the digestive tract.  You may develop or continue to have constipation because increased hormones slow digestion and cause the muscles that push waste through your intestines to relax.  You may develop hemorrhoids. These are swollen veins (varicose veins) in the rectum that can itch or be painful.  You may develop swollen, bulging veins (varicose veins) in your legs.  You may have increased body aches in the pelvis, back, or thighs. This is due to weight gain and increased hormones that are relaxing your joints.  You may have changes in your hair. These can include thickening of your hair, rapid growth, and changes in texture. Some women also have hair loss during or after pregnancy, or hair that feels dry or thin. Your hair will most likely return to normal after your baby is born.  Your breasts will continue to grow and they will continue to become tender. A yellow fluid (colostrum) may leak from your breasts. This is the first milk you are producing for your baby.  Your belly button may stick out.  You may notice more swelling in your hands,  face, or ankles.  You may have increased tingling or numbness in your hands, arms, and legs. The skin on your belly may also feel numb.  You may feel short of breath because of your expanding uterus.  You may have more problems sleeping. This can be caused by the size of your belly, increased need to urinate, and an increase in your body's metabolism.  You may notice the fetus "dropping," or moving lower in your abdomen (lightening).  You may have increased vaginal discharge.  You may notice your joints feel loose and you may have pain around your pelvic bone. What to expect at prenatal visits You will have prenatal exams every 2 weeks until week 36. Then you will have weekly prenatal exams. During a routine prenatal visit:  You will be weighed to make sure you and the baby are growing normally.  Your blood pressure will be taken.  Your abdomen will be measured to track your baby's growth.  The fetal heartbeat will be listened to.  Any test results from the previous visit will be discussed.  You may have a cervical check near your due date to see if your cervix has softened or thinned (effaced).  You will be tested for Group B streptococcus. This happens between 35 and 37 weeks. Your health care provider may ask you:  What your birth plan is.  How you are feeling.  If you are feeling the baby move.  If you have had any abnormal   symptoms, such as leaking fluid, bleeding, severe headaches, or abdominal cramping.  If you are using any tobacco products, including cigarettes, chewing tobacco, and electronic cigarettes.  If you have any questions. Other tests or screenings that may be performed during your third trimester include:  Blood tests that check for low iron levels (anemia).  Fetal testing to check the health, activity level, and growth of the fetus. Testing is done if you have certain medical conditions or if there are problems during the pregnancy.  Nonstress test  (NST). This test checks the health of your baby to make sure there are no signs of problems, such as the baby not getting enough oxygen. During this test, a belt is placed around your belly. The baby is made to move, and its heart rate is monitored during movement. What is false labor? False labor is a condition in which you feel small, irregular tightenings of the muscles in the womb (contractions) that usually go away with rest, changing position, or drinking water. These are called Braxton Hicks contractions. Contractions may last for hours, days, or even weeks before true labor sets in. If contractions come at regular intervals, become more frequent, increase in intensity, or become painful, you should see your health care provider. What are the signs of labor?  Abdominal cramps.  Regular contractions that start at 10 minutes apart and become stronger and more frequent with time.  Contractions that start on the top of the uterus and spread down to the lower abdomen and back.  Increased pelvic pressure and dull back pain.  A watery or bloody mucus discharge that comes from the vagina.  Leaking of amniotic fluid. This is also known as your "water breaking." It could be a slow trickle or a gush. Let your health care provider know if it has a color or strange odor. If you have any of these signs, call your health care provider right away, even if it is before your due date. Follow these instructions at home: Medicines  Follow your health care provider's instructions regarding medicine use. Specific medicines may be either safe or unsafe to take during pregnancy.  Take a prenatal vitamin that contains at least 600 micrograms (mcg) of folic acid.  If you develop constipation, try taking a stool softener if your health care provider approves. Eating and drinking   Eat a balanced diet that includes fresh fruits and vegetables, whole grains, good sources of protein such as meat, eggs, or tofu,  and low-fat dairy. Your health care provider will help you determine the amount of weight gain that is right for you.  Avoid raw meat and uncooked cheese. These carry germs that can cause birth defects in the baby.  If you have low calcium intake from food, talk to your health care provider about whether you should take a daily calcium supplement.  Eat four or five small meals rather than three large meals a day.  Limit foods that are high in fat and processed sugars, such as fried and sweet foods.  To prevent constipation: ? Drink enough fluid to keep your urine clear or pale yellow. ? Eat foods that are high in fiber, such as fresh fruits and vegetables, whole grains, and beans. Activity  Exercise only as directed by your health care provider. Most women can continue their usual exercise routine during pregnancy. Try to exercise for 30 minutes at least 5 days a week. Stop exercising if you experience uterine contractions.  Avoid heavy lifting.  Do   not exercise in extreme heat or humidity, or at high altitudes.  Wear low-heel, comfortable shoes.  Practice good posture.  You may continue to have sex unless your health care provider tells you otherwise. Relieving pain and discomfort  Take frequent breaks and rest with your legs elevated if you have leg cramps or low back pain.  Take warm sitz baths to soothe any pain or discomfort caused by hemorrhoids. Use hemorrhoid cream if your health care provider approves.  Wear a good support bra to prevent discomfort from breast tenderness.  If you develop varicose veins: ? Wear support pantyhose or compression stockings as told by your healthcare provider. ? Elevate your feet for 15 minutes, 3-4 times a day. Prenatal care  Write down your questions. Take them to your prenatal visits.  Keep all your prenatal visits as told by your health care provider. This is important. Safety  Wear your seat belt at all times when driving.  Make  a list of emergency phone numbers, including numbers for family, friends, the hospital, and police and fire departments. General instructions  Avoid cat litter boxes and soil used by cats. These carry germs that can cause birth defects in the baby. If you have a cat, ask someone to clean the litter box for you.  Do not travel far distances unless it is absolutely necessary and only with the approval of your health care provider.  Do not use hot tubs, steam rooms, or saunas.  Do not drink alcohol.  Do not use any products that contain nicotine or tobacco, such as cigarettes and e-cigarettes. If you need help quitting, ask your health care provider.  Do not use any medicinal herbs or unprescribed drugs. These chemicals affect the formation and growth of the baby.  Do not douche or use tampons or scented sanitary pads.  Do not cross your legs for long periods of time.  To prepare for the arrival of your baby: ? Take prenatal classes to understand, practice, and ask questions about labor and delivery. ? Make a trial run to the hospital. ? Visit the hospital and tour the maternity area. ? Arrange for maternity or paternity leave through employers. ? Arrange for family and friends to take care of pets while you are in the hospital. ? Purchase a rear-facing car seat and make sure you know how to install it in your car. ? Pack your hospital bag. ? Prepare the baby's nursery. Make sure to remove all pillows and stuffed animals from the baby's crib to prevent suffocation.  Visit your dentist if you have not gone during your pregnancy. Use a soft toothbrush to brush your teeth and be gentle when you floss. Contact a health care provider if:  You are unsure if you are in labor or if your water has broken.  You become dizzy.  You have mild pelvic cramps, pelvic pressure, or nagging pain in your abdominal area.  You have lower back pain.  You have persistent nausea, vomiting, or diarrhea.   You have an unusual or bad smelling vaginal discharge.  You have pain when you urinate. Get help right away if:  Your water breaks before 37 weeks.  You have regular contractions less than 5 minutes apart before 37 weeks.  You have a fever.  You are leaking fluid from your vagina.  You have spotting or bleeding from your vagina.  You have severe abdominal pain or cramping.  You have rapid weight loss or weight gain.  You have   shortness of breath with chest pain.  You notice sudden or extreme swelling of your face, hands, ankles, feet, or legs.  Your baby makes fewer than 10 movements in 2 hours.  You have severe headaches that do not go away when you take medicine.  You have vision changes. Summary  The third trimester is from week 28 through week 40, months 7 through 9. The third trimester is a time when the unborn baby (fetus) is growing rapidly.  During the third trimester, your discomfort may increase as you and your baby continue to gain weight. You may have abdominal, leg, and back pain, sleeping problems, and an increased need to urinate.  During the third trimester your breasts will keep growing and they will continue to become tender. A yellow fluid (colostrum) may leak from your breasts. This is the first milk you are producing for your baby.  False labor is a condition in which you feel small, irregular tightenings of the muscles in the womb (contractions) that eventually go away. These are called Braxton Hicks contractions. Contractions may last for hours, days, or even weeks before true labor sets in.  Signs of labor can include: abdominal cramps; regular contractions that start at 10 minutes apart and become stronger and more frequent with time; watery or bloody mucus discharge that comes from the vagina; increased pelvic pressure and dull back pain; and leaking of amniotic fluid. This information is not intended to replace advice given to you by your health  care provider. Make sure you discuss any questions you have with your health care provider. Document Released: 01/19/2001 Document Revised: 05/18/2018 Document Reviewed: 03/02/2016 Elsevier Patient Education  2020 [employer]  

## 2018-12-05 NOTE — Progress Notes (Signed)
Subjective:  Sue Cruz is a 28 y.o. G3P2002 at [redacted]w[redacted]d being seen today for ongoing prenatal care.  She is currently monitored for the following issues for this low-risk pregnancy and has Supervision of other normal pregnancy, antepartum and Nausea and vomiting during pregnancy on their problem list.  Patient reports no complaints.  Contractions: Irritability. Vag. Bleeding: None.  Movement: Present. Denies leaking of fluid.   The following portions of the patient's history were reviewed and updated as appropriate: allergies, current medications, past family history, past medical history, past social history, past surgical history and problem list. Problem list updated.  Objective:   Vitals:   12/05/18 0833  BP: 125/78  Pulse: 88  Weight: 183 lb (83 kg)    Fetal Status: Fetal Heart Rate (bpm): 155   Movement: Present     General:  Alert, oriented and cooperative. Patient is in no acute distress.  Skin: Skin is warm and dry. No rash noted.   Cardiovascular: Normal heart rate noted  Respiratory: Normal respiratory effort, no problems with respiration noted  Abdomen: Soft, gravid, appropriate for gestational age. Pain/Pressure: Present     Pelvic:  Cervical exam deferred        Extremities: Normal range of motion.  Edema: None  Mental Status: Normal mood and affect. Normal behavior. Normal judgment and thought content.   Urinalysis:      Assessment and Plan:  Pregnancy: G3P2002 at [redacted]w[redacted]d  1. Supervision of other normal pregnancy, antepartum Stable - Glucose Tolerance, 2 Hours w/1 Hour - CBC - HIV Antibody (routine testing w rflx) - RPR  Preterm labor symptoms and general obstetric precautions including but not limited to vaginal bleeding, contractions, leaking of fluid and fetal movement were reviewed in detail with the patient. Please refer to After Visit Summary for other counseling recommendations.  Return in about 3 weeks (around 12/26/2018) for OB visit,  virtual.   Chancy Milroy, MD

## 2018-12-06 LAB — CBC
Hematocrit: 32.6 % — ABNORMAL LOW (ref 34.0–46.6)
Hemoglobin: 10.7 g/dL — ABNORMAL LOW (ref 11.1–15.9)
MCH: 27.4 pg (ref 26.6–33.0)
MCHC: 32.8 g/dL (ref 31.5–35.7)
MCV: 83 fL (ref 79–97)
Platelets: 308 10*3/uL (ref 150–450)
RBC: 3.91 x10E6/uL (ref 3.77–5.28)
RDW: 12.8 % (ref 11.7–15.4)
WBC: 9.5 10*3/uL (ref 3.4–10.8)

## 2018-12-06 LAB — HIV ANTIBODY (ROUTINE TESTING W REFLEX): HIV Screen 4th Generation wRfx: NONREACTIVE

## 2018-12-06 LAB — GLUCOSE TOLERANCE, 2 HOURS W/ 1HR
Glucose, 1 hour: 152 mg/dL (ref 65–179)
Glucose, 2 hour: 101 mg/dL (ref 65–152)
Glucose, Fasting: 82 mg/dL (ref 65–91)

## 2018-12-06 LAB — RPR: RPR Ser Ql: NONREACTIVE

## 2018-12-26 ENCOUNTER — Encounter: Payer: Self-pay | Admitting: Nurse Practitioner

## 2018-12-26 ENCOUNTER — Telehealth (INDEPENDENT_AMBULATORY_CARE_PROVIDER_SITE_OTHER): Payer: Medicaid Other | Admitting: Nurse Practitioner

## 2018-12-26 VITALS — BP 116/66 | HR 81

## 2018-12-26 DIAGNOSIS — Z3480 Encounter for supervision of other normal pregnancy, unspecified trimester: Secondary | ICD-10-CM

## 2018-12-26 DIAGNOSIS — O99891 Other specified diseases and conditions complicating pregnancy: Secondary | ICD-10-CM

## 2018-12-26 DIAGNOSIS — Z3A30 30 weeks gestation of pregnancy: Secondary | ICD-10-CM

## 2018-12-26 DIAGNOSIS — O219 Vomiting of pregnancy, unspecified: Secondary | ICD-10-CM

## 2018-12-26 DIAGNOSIS — M549 Dorsalgia, unspecified: Secondary | ICD-10-CM

## 2018-12-26 MED ORDER — DOXYLAMINE-PYRIDOXINE 10-10 MG PO TBEC: DELAYED_RELEASE_TABLET | ORAL | 2 refills | Status: DC

## 2018-12-26 NOTE — Progress Notes (Signed)
Pt states she is having cramping daily with some possible [address].

## 2018-12-26 NOTE — Progress Notes (Signed)
I connected with@ on 12/26/18 at 10:35 AM EST by: Webex (MyChart was not working) and verified that I am speaking with the correct person using two identifiers.  Patient is located at home and provider is located at [place] office.     The purpose of this virtual visit is to provide medical care while limiting exposure to the novel coronavirus. I discussed the limitations, risks, security and privacy concerns of performing an evaluation and management service by Earlie Server, NP and the availability of in person appointments. I also discussed with the patient that there may be a patient responsible charge related to this service. By engaging in this virtual visit, you consent to the provision of healthcare.  Additionally, you authorize for your insurance to be billed for the services provided during this visit.  The patient expressed understanding and agreed to proceed.  The following staff members participated in the virtual visit:  Lewie Loron, CMA,  Earlie Server, NP    PRENATAL VISIT NOTE  Subjective:  MEL TADROS is a 28 y.o. G3P2002 at [redacted]w[redacted]d  for phone visit for ongoing prenatal care.  She is currently monitored for the following issues for this low-risk pregnancy and has Supervision of other normal pregnancy, antepartum and Nausea and vomiting during pregnancy on their problem list.  Patient reports nausea.  Contractions: Irritability. Vag. Bleeding: None.  Movement: Present. Denies leaking of fluid.   The following portions of the patient's history were reviewed and updated as appropriate: allergies, current medications, past family history, past medical history, past social history, past surgical history and problem list.   Objective:   Vitals:   12/26/18 1051  BP: 116/66  Pulse: 81   Self-Obtained  Fetal Status:     Movement: Present     Assessment and Plan:  Pregnancy: G3P2002 at [redacted]w[redacted]d 1. Supervision of other normal pregnancy, antepartum Advised to take BPs weekly and  record in [employer] app Previously declined vaccines but discussed again today highlightling advantages and client has decided to take recommended vaccines.  2. Back pain in pregnancy Using flexeril daily and advised to use ice pack at night  3. Nausea  during pregnancy Daily and interferes with sleep Diclegis ordered   Preterm labor symptoms and general obstetric precautions including but not limited to vaginal bleeding, contractions, leaking of fluid and fetal movement were reviewed in detail with the patient.  Return in about 2 weeks (around 01/09/2019) for in person ROB - needs vaccines.  Future Appointments  Date Time Provider [place]  01/09/2019  1:15 PM Laury Deep, CNM [place] None     Time spent on virtual visit: 8 minutes  Earlie Server, RN, MSN, NP-BC Nurse Practitioner, [facility] for [employer], [facility] 12/26/2018 2:23 PM

## 2019-01-09 ENCOUNTER — Ambulatory Visit (INDEPENDENT_AMBULATORY_CARE_PROVIDER_SITE_OTHER): Payer: Medicaid Other | Admitting: Obstetrics and Gynecology

## 2019-01-09 ENCOUNTER — Encounter: Payer: Self-pay | Admitting: Obstetrics and Gynecology

## 2019-01-09 ENCOUNTER — Other Ambulatory Visit: Payer: Self-pay

## 2019-01-09 VITALS — BP 129/75 | HR 94 | Wt 187.0 lb

## 2019-01-09 DIAGNOSIS — Z3A32 32 weeks gestation of pregnancy: Secondary | ICD-10-CM

## 2019-01-09 DIAGNOSIS — Z23 Encounter for immunization: Secondary | ICD-10-CM

## 2019-01-09 DIAGNOSIS — O26893 Other specified pregnancy related conditions, third trimester: Secondary | ICD-10-CM

## 2019-01-09 DIAGNOSIS — G4700 Insomnia, unspecified: Secondary | ICD-10-CM

## 2019-01-09 DIAGNOSIS — Z3480 Encounter for supervision of other normal pregnancy, unspecified trimester: Secondary | ICD-10-CM

## 2019-01-09 NOTE — Progress Notes (Signed)
Pt is here for ROB. [redacted]w[redacted]d.  

## 2019-01-09 NOTE — Progress Notes (Signed)
   LOW-RISK PREGNANCY OFFICE VISIT Patient name: Sue Cruz MRN [id number]  Date of birth: [birth year] Chief Complaint:   Routine Prenatal Visit  History of Present Illness:   WATEEN VARON is a 28 y.o. G73P2002 female at [redacted]w[redacted]d with an Estimated Date of Delivery: 03/05/19 being seen today for ongoing management of a low-risk pregnancy.  Today she reports pelvic pain/pressure at bedtime and insomnia every night. She also complains of a darkened color on her neck. She wants to know if that is normal and if there are any remedies to get rid of it. She denies any rashes or open wounds on her neck. Contractions: Irritability. Vag. Bleeding: None.  Movement: Present. denies leaking of fluid. Review of Systems:   Pertinent items are noted in HPI Denies abnormal vaginal discharge w/ itching/odor/irritation, headaches, visual changes, shortness of breath, chest pain, abdominal pain, severe nausea/vomiting, or problems with urination or bowel movements unless otherwise stated above. Pertinent History Reviewed:  Reviewed past medical,surgical, social, obstetrical and family history.  Reviewed problem list, medications and allergies. Physical Assessment:   Vitals:   01/09/19 1306 01/09/19 1310  BP: 130/76 129/75  Pulse: 93 94  Weight: 187 lb (84.8 kg)   Body mass index is 36.52 kg/m.        Physical Examination:   General appearance: Well appearing, and in no distress  Mental status: Alert, oriented to person, place, and time  Skin: Warm & dry  Cardiovascular: Normal heart rate noted  Respiratory: Normal respiratory effort, no distress  Abdomen: Soft, gravid, nontender  Pelvic: Cervical exam deferred         Extremities: Edema: None  Fetal Status:   Fundal Height: 34 cm Movement: Present    No results found for this or any previous visit (from the past 24 hour(s)).  Assessment & Plan:  1) Low-risk pregnancy G3P2002 at [redacted]w[redacted]d with an Estimated Date of Delivery: 03/05/19   2) Supervision of  other normal pregnancy, antepartum  - Flu Vaccine QUAD 36+ mos IM,  - Tdap vaccine greater than or equal to 7yo IM  3) Insomnia, unspecified type - Advised to take Magnesium supplements 250 mg 2 tablets hs for sleep   Meds: No orders of the defined types were placed in this encounter.  Labs/procedures today: none  Plan:  Continue routine obstetrical care   Reviewed: Preterm labor symptoms and general obstetric precautions including but not limited to vaginal bleeding, contractions, leaking of fluid and fetal movement were reviewed in detail with the patient.  All questions were answered. Has home bp cuff. Check bp weekly, let us know if >140/90.   Follow-up: Return in about 4 weeks (around 02/06/2019) for Return OB w/GBS.  Orders Placed This Encounter  Procedures  . Flu Vaccine QUAD 36+ mos IM  . Tdap vaccine greater than or equal to 7yo IM   [employer] MSN, CNM 01/09/2019 1:32 PM

## 2019-01-09 NOTE — Patient Instructions (Signed)
Tests and Screening During Pregnancy Having certain tests and screenings during pregnancy is an important part of your prenatal care. These tests help your health care provider find problems that might affect your pregnancy. Some tests are done for all pregnant women, and some are optional. Most of the tests and screenings do not pose any risks for you or your baby. You may need additional testing if any routine tests indicate a problem. Tests and screenings done in early pregnancy Some tests and screenings you can expect to have in early pregnancy include:  Blood tests, such as: ? Complete blood count (CBC). This test is done to check your red and white blood cells. It can help identify a risk for anemia, infection, or bleeding. ? Blood typing. This test determines your blood type as well as whether you have a certain protein in your red blood cells (Rh factor). If you do not have this protein (Rh negative) and your baby does have it (Rh positive), your body could make antibodies to the Rh factor. This could be dangerous to your baby's health. ? Tests to check for diseases that can cause birth defects or can be passed to your baby, such as:  German measles (rubella). The test indicates whether you are immune to rubella.  Hepatitis B and C. All women are tested for hepatitis B. You may also be tested for hepatitis C if you have risk factors for the condition.  Zika virus infection. You may have a blood or urine test to check for this infection if you or your partner has traveled to an area where the virus occurs.  Urine testing. A urine sample can be tested for diabetes, protein in your urine, and signs of infection.  Testing for sexually transmitted infections (STIs), such as HIV, syphilis, and chlamydia.  Testing for tuberculosis. You may have this skin test if you are at risk for tuberculosis.  Fetal ultrasound. This is an imaging study of your developing baby. It is done using sound waves  and a computer. This test may be done at 11-14 weeks to confirm your pregnancy and help determine your due date. Tests and screenings done later in pregnancy Certain tests are done for the first time during later pregnancy. In addition, some of the tests that were done in early pregnancy are repeated at this time. Some common tests you can expect to have later in pregnancy include:  Rh antibody testing. If you are Rh negative, you will have a blood test at about 28 weeks of pregnancy to see if you are producing Rh antibodies. If you have not started to make antibodies, you will be given an injection to prevent you from making antibodies for the rest of your pregnancy.  Glucose screening. This tests your blood sugar to find out whether you are developing the type of diabetes that occurs during pregnancy (gestational diabetes). You may have this screening earlier if you have risk factors for diabetes.  Screening for group B streptococcus (GBS). GBS is a type of bacteria that may live in your rectum or vagina. You may have GBS without any symptoms. GBS can spread to your baby during birth. This test involves doing a rectal and vaginal swab at 35-37 weeks of pregnancy. If testing is positive for GBS, you may be treated with antibiotic medicine.  CBC to check for anemia and blood-clotting ability.  Urine tests to check for protein, which can be a sign of a condition called preeclampsia.  Fetal ultrasound. This   may be repeated at 16-20 weeks to check how your baby is growing and developing. Screening for birth defects Some birth defects are caused by abnormal genes passed down through families. Early in your pregnancy, tests can be done to find out if your baby is at risk for a genetic disorder. This testing is optional. The type of testing recommended for you will depend on your family and medical history, your ethnicity, and your age. Testing may include:  Screening tests. These tests may include an  ultrasound, blood tests, or a combination of both. The blood tests are used to check for abnormal genes, and the ultrasound is done to look for early birth defects.  Carrier screening. This test involves checking the blood or saliva of both parents to see if they carry abnormal genes that could be passed down to a baby. If genetic screening shows that your baby is at risk for a genetic defect, additional diagnostic testing may be recommended, such as:  Amniocentesis. This involves testing a sample of fluid from your womb (amniotic fluid).  Chorionic villus sampling. In this test, a sample of cells from your placenta is checked for abnormal cells. Unlike other tests done during pregnancy, diagnostic testing does have some risk for your pregnancy. Talk to your health care provider about the risks and benefits of genetic testing. Where to find more information  American Pregnancy Association: americanpregnancy.org/prenatal-testing  Office on Women's Health: KeywordPortfolios.com.br  March of Dimes: marchofdimes.org/pregnancy Questions to ask your health care provider  What routine tests are recommended for me?  When and how will these tests be done?  When will I get the results of routine tests?  What do the results of these tests mean for me or my baby?  Do you recommend any genetic screening tests? Which ones?  Should I see a genetic counselor before having genetic screening? Summary  Having tests and screenings during pregnancy is an important part of your prenatal care.  In early pregnancy, testing may be done to check blood type, Rh status, and risks for various conditions that can affect your baby.  Fetal ultrasound may be done in early pregnancy to confirm a pregnancy and later to look for any birth defects.  Later in pregnancy, tests may include screening for GBS and gestational diabetes.  Genetic testing is optional. Consider talking to a genetic counselor about this  testing. This information is not intended to replace advice given to you by your health care provider. Make sure you discuss any questions you have with your health care provider. Document Released: 04/11/2017 Document Revised: 05/17/2018 Document Reviewed: 04/11/2017 Elsevier Patient Education  [place].    Signs and Symptoms of Labor Labor is your body's natural process of moving your baby, placenta, and umbilical cord out of your uterus. The process of labor usually starts when your baby is full-term, between 21 and 40 weeks of pregnancy. How will I know when I am close to going into labor? As your body prepares for labor and the birth of your baby, you may notice the following symptoms in the weeks and days before true labor starts:  Having a strong desire to get your home ready to receive your new baby. This is called nesting. Nesting may be a sign that labor is approaching, and it may occur several weeks before birth. Nesting may involve cleaning and organizing your home.  Passing a small amount of thick, bloody mucus out of your vagina (normal bloody show or losing your  mucus plug). This may happen more than a week before labor begins, or it might occur right before labor begins as the opening of the cervix starts to widen (dilate). For some women, the entire mucus plug passes at once. For others, smaller portions of the mucus plug may gradually pass over several days.  Your baby moving (dropping) lower in your pelvis to get into position for birth (lightening). When this happens, you may feel more pressure on your bladder and pelvic bone and less pressure on your ribs. This may make it easier to breathe. It may also cause you to need to urinate more often and have problems with bowel movements.  Having "practice contractions" (Braxton Hicks contractions) that occur at irregular (unevenly spaced) intervals that are more than 10 minutes apart. This is also called false labor. False  labor contractions are common after exercise or sexual activity, and they will stop if you change position, rest, or drink fluids. These contractions are usually mild and do not get stronger over time. They may feel like: ? A backache or back pain. ? Mild cramps, similar to menstrual cramps. ? Tightening or pressure in your abdomen. Other early symptoms that labor may be starting soon include:  Nausea or loss of appetite.  Diarrhea.  Having a sudden burst of energy, or feeling very tired.  Mood changes.  Having trouble sleeping. How will I know when labor has begun? Signs that true labor has begun may include:  Having contractions that come at regular (evenly spaced) intervals and increase in intensity. This may feel like more intense tightening or pressure in your abdomen that moves to your back. ? Contractions may also feel like rhythmic pain in your upper thighs or back that comes and goes at regular intervals. ? For first-time mothers, this change in intensity of contractions often occurs at a more gradual pace. ? Women who have given birth before may notice a more rapid progression of contraction changes.  Having a feeling of pressure in the vaginal area.  Your water breaking (rupture of membranes). This is when the sac of fluid that surrounds your baby breaks. When this happens, you will notice fluid leaking from your vagina. This may be clear or blood-tinged. Labor usually starts within 24 hours of your water breaking, but it may take longer to begin. ? Some women notice this as a gush of fluid. ? Others notice that their underwear repeatedly becomes damp. Follow these instructions at home:   When labor starts, or if your water breaks, call your health care provider or nurse care line. Based on your situation, they will determine when you should go in for an exam.  When you are in early labor, you may be able to rest and manage symptoms at home. Some strategies to try at home  include: ? Breathing and relaxation techniques. ? Taking a warm bath or shower. ? Listening to music. ? Using a heating pad on the lower back for pain. If you are directed to use heat:  Place a towel between your skin and the heat source.  Leave the heat on for 20-30 minutes.  Remove the heat if your skin turns bright red. This is especially important if you are unable to feel pain, heat, or cold. You may have a greater risk of getting burned. Get help right away if:  You have painful, regular contractions that are 5 minutes apart or less.  Labor starts before you are [redacted] weeks along in your pregnancy.  You have a fever.  You have a headache that does not go away.  You have bright red blood coming from your vagina.  You do not feel your baby moving.  You have a sudden onset of: ? Severe headache with vision problems. ? Nausea, vomiting, or diarrhea. ? Chest pain or shortness of breath. These symptoms may be an emergency. If your health care provider recommends that you go to the hospital or birth center where you plan to deliver, do not drive yourself. Have someone else drive you, or call emergency services (911 in the [place] Summary  Labor is your body's natural process of moving your baby, placenta, and umbilical cord out of your uterus.  The process of labor usually starts when your baby is full-term, between 32 and 40 weeks of pregnancy.  When labor starts, or if your water breaks, call your health care provider or nurse care line. Based on your situation, they will determine when you should go in for an exam. This information is not intended to replace advice given to you by your health care provider. Make sure you discuss any questions you have with your health care provider. Document Released: 07/02/2016 Document Revised: 10/25/2016 Document Reviewed: 07/02/2016 Elsevier Patient Education  2020 [employer].

## 2019-02-06 ENCOUNTER — Other Ambulatory Visit: Payer: Self-pay

## 2019-02-06 ENCOUNTER — Encounter: Payer: Self-pay | Admitting: Family Medicine

## 2019-02-06 ENCOUNTER — Ambulatory Visit (INDEPENDENT_AMBULATORY_CARE_PROVIDER_SITE_OTHER): Payer: Medicaid Other | Admitting: Family Medicine

## 2019-02-06 ENCOUNTER — Other Ambulatory Visit (HOSPITAL_COMMUNITY)
Admission: RE | Admit: 2019-02-06 | Discharge: 2019-02-06 | Disposition: A | Discharge status: Home / Self Care | Payer: Medicaid Other | Source: Ambulatory Visit | Attending: Family Medicine | Admitting: Family Medicine

## 2019-02-06 VITALS — BP 130/79 | HR 99 | Wt 191.0 lb

## 2019-02-06 DIAGNOSIS — O321XX0 Maternal care for breech presentation, not applicable or unspecified: Secondary | ICD-10-CM

## 2019-02-06 DIAGNOSIS — Z3480 Encounter for supervision of other normal pregnancy, unspecified trimester: Secondary | ICD-10-CM

## 2019-02-06 DIAGNOSIS — Z3A36 36 weeks gestation of pregnancy: Secondary | ICD-10-CM

## 2019-02-06 NOTE — Progress Notes (Signed)
ROB  GBS/CT due today Pt request Cervix check CC: pt has concerns about baby's presentation.

## 2019-02-06 NOTE — Progress Notes (Signed)
   PRENATAL VISIT NOTE  Subjective:  Sue Cruz is a 28 y.o. G3P2002 at [redacted]w[redacted]d being seen today for ongoing prenatal care.  She is currently monitored for the following issues for this low-risk pregnancy and has Supervision of other normal pregnancy, antepartum and Nausea and vomiting during pregnancy on their problem list.  Patient reports occasional contractions.  Contractions: Irritability. Vag. Bleeding: None.  Movement: Present. Denies leaking of fluid.   The following portions of the patient's history were reviewed and updated as appropriate: allergies, current medications, past family history, past medical history, past social history, past surgical history and problem list.   Objective:   Vitals:   02/06/19 1319  BP: 130/79  Pulse: 99  Weight: 191 lb (86.6 kg)    Fetal Status: Fetal Heart Rate (bpm): 148   Movement: Present     General:  Alert, oriented and cooperative. Patient is in no acute distress.  Skin: Skin is warm and dry. No rash noted.   Cardiovascular: Normal heart rate noted  Respiratory: Normal respiratory effort, no problems with respiration noted  Abdomen: Soft, gravid, appropriate for gestational age.  Pain/Pressure: Present     Pelvic: Cervical exam performed        Extremities: Normal range of motion.  Edema: None  Mental Status: Normal mood and affect. Normal behavior. Normal judgment and thought content.   Assessment and Plan:  Pregnancy: G3P2002 at [redacted]w[redacted]d 1. Supervision of other normal pregnancy, antepartum FHT and FH normal - GBS - Cervicovaginal ancillary only( [place])  2. Engagement of fetus in breech position, single or unspecified fetus Discussed ECV. Patient would like to wait until next week to see if still breech before scheduling  Preterm labor symptoms and general obstetric precautions including but not limited to vaginal bleeding, contractions, leaking of fluid and fetal movement were reviewed in detail with the patient. Please  refer to After Visit Summary for other counseling recommendations.   Return in about 1 week (around 02/13/2019) for OB f/u, In Office.  No future appointments.  Truett Mainland, DO

## 2019-02-07 LAB — CERVICOVAGINAL ANCILLARY ONLY
Bacterial Vaginitis (gardnerella): NEGATIVE
Candida Glabrata: NEGATIVE
Candida Vaginitis: NEGATIVE
Chlamydia: NEGATIVE
Comment: NEGATIVE
Comment: NEGATIVE
Comment: NEGATIVE
Comment: NEGATIVE
Comment: NEGATIVE
Comment: NORMAL
Neisseria Gonorrhea: NEGATIVE
Trichomonas: NEGATIVE

## 2019-02-08 LAB — STREP GP B NAA: Strep Gp B NAA: NEGATIVE

## 2019-02-14 ENCOUNTER — Other Ambulatory Visit: Payer: Self-pay | Admitting: Advanced Practice Midwife

## 2019-02-14 ENCOUNTER — Telehealth (HOSPITAL_COMMUNITY): Payer: Self-pay | Admitting: *Deleted

## 2019-02-14 ENCOUNTER — Other Ambulatory Visit: Payer: Self-pay

## 2019-02-14 ENCOUNTER — Ambulatory Visit (INDEPENDENT_AMBULATORY_CARE_PROVIDER_SITE_OTHER): Payer: Medicaid Other | Admitting: Obstetrics and Gynecology

## 2019-02-14 ENCOUNTER — Other Ambulatory Visit (HOSPITAL_COMMUNITY)
Admission: RE | Admit: 2019-02-14 | Discharge: 2019-02-14 | Disposition: A | Discharge status: Home / Self Care | Payer: Medicaid Other | Source: Ambulatory Visit | Attending: Obstetrics and Gynecology | Admitting: Obstetrics and Gynecology

## 2019-02-14 ENCOUNTER — Encounter: Payer: Self-pay | Admitting: Obstetrics and Gynecology

## 2019-02-14 VITALS — BP 113/67 | HR 98 | Wt 193.0 lb

## 2019-02-14 DIAGNOSIS — Z01812 Encounter for preprocedural laboratory examination: Secondary | ICD-10-CM | POA: Diagnosis present

## 2019-02-14 DIAGNOSIS — O321XX0 Maternal care for breech presentation, not applicable or unspecified: Secondary | ICD-10-CM

## 2019-02-14 DIAGNOSIS — Z20822 Contact with and (suspected) exposure to covid-19: Secondary | ICD-10-CM | POA: Diagnosis not present

## 2019-02-14 DIAGNOSIS — Z3A37 37 weeks gestation of pregnancy: Secondary | ICD-10-CM

## 2019-02-14 DIAGNOSIS — O329XX0 Maternal care for malpresentation of fetus, unspecified, not applicable or unspecified: Secondary | ICD-10-CM | POA: Insufficient documentation

## 2019-02-14 DIAGNOSIS — Z3480 Encounter for supervision of other normal pregnancy, unspecified trimester: Secondary | ICD-10-CM

## 2019-02-14 LAB — SARS CORONAVIRUS 2 (TAT 6-24 HRS): SARS Coronavirus 2: NEGATIVE

## 2019-02-14 NOTE — Addendum Note (Signed)
Addended by: Hermina Staggers on: 02/14/2019 01:52 PM   Modules accepted: Orders, SmartSet

## 2019-02-14 NOTE — Patient Instructions (Signed)
Breech Birth A breech birth is when a baby is born with the buttocks or feet first. Most babies are in a head down (vertex) position when they are born. There are three types of breech babies:  When the baby's buttocks are showing first in the vagina (birth canal) with the legs bent at the knees and the feet down near the buttocks (complete breech).  When the baby's buttocks are showing first in the birth canal with the legs straight up and the feet at the baby's head (frank breech).  When one or both of the baby's feet are showing first in the birth canal along with the buttocks (footling breech). What are the health risks of having a breech birth? Having a breech birth increases the health risks to your baby. A breech birth may cause the following:  Umbilical cord prolapse. This is when the umbilical cord enters the birth canal ahead of the baby, before or during labor. This can cause the cord to become pinched or compressed as labor continues. This can reduce the flow of blood and oxygen to the baby.  The baby getting stuck in the birth canal, which can cause injury or, rarely, death.  Injury to the baby's nerves in the shoulder, arm, and hand (brachial plexus injury) when delivered. What increases the risk of having a breech baby? It is not known what causes your baby to be breech. However, you are more likely to have a breech baby if:  You have had a previous pregnancy.  You are having more than one baby.  Your baby has certain birth (congenital) defects.  You have started your labor earlier than expected (premature labor).  You have problems with your uterus, such as a tumor or abnormally shaped uterus.  You have too much or not enough fluid surrounding the baby (amniotic fluid). How do I know if my baby is breech? There are no symptoms for you to know that your baby is breech. When you are close to your due date, your health care provider can tell if your baby is breech by  doing:  An abdominal or vaginal (pelvic) exam.  An ultrasound. What can be done if my baby is breech?  Your health care provider may try to turn the baby in your uterus. He or she will use a procedure called external cephalic version (ECV). He or she will place both hands on your abdomen and gently and slowly turn the baby around. It is important to know that ECV can increase your chances of suddenly going into labor. For this reason, an ECV is only done toward the end of a healthy pregnancy. The baby may remain in this position, but sometimes he or she may turn back to the breech position. You and your health care provider will discuss if an ECV is recommended for you and your baby. How will I delivery my baby if he or she is breech? You and your health care provider will discuss the best way to deliver your baby. If your baby is breech, it is less likely that a vaginal delivery will be recommended due to the risks to you and your baby. Your health care provider may recommend that you deliver your baby through a Cesarean section (C-section). A C-section is the surgical delivery of a baby through an incision in the abdomen and the uterus. Summary  A breech birth is when a baby is born with the buttocks or feet first.  Having a breech birth may   increase the risks to your baby.  Your health care provider may try to turn your baby in your uterus using a procedure called an external cephalic version (ECV).  If your baby cannot be turned to a head down position or if your baby remains in a breech position, your health care provider will make recommendations about the safest way to deliver your baby. This information is not intended to replace advice given to you by your health care provider. Make sure you discuss any questions you have with your health care provider. Document Revised: 01/07/2017 Document Reviewed: 10/21/2016 Elsevier Patient Education  2020 Else[employer] Inc[employer]  

## 2019-02-14 NOTE — Progress Notes (Signed)
Subjective:  Sue Cruz is a 29 y.o. G3P2002 at [redacted]w[redacted]d being seen today for ongoing prenatal care.  She is currently monitored for the following issues for this low-risk pregnancy and has Supervision of other normal pregnancy, antepartum; Nausea and vomiting during pregnancy; and Malpresentation of fetus on their problem list.  Patient reports no complaints.  Contractions: Irregular. Vag. Bleeding: None.  Movement: Present. Denies leaking of fluid.   The following portions of the patient's history were reviewed and updated as appropriate: allergies, current medications, past family history, past medical history, past social history, past surgical history and problem list. Problem list updated.  Objective:   Vitals:   02/14/19 0913  BP: 113/67  Pulse: 98  Weight: 193 lb (87.5 kg)    Fetal Status: Fetal Heart Rate (bpm): 155   Movement: Present     General:  Alert, oriented and cooperative. Patient is in no acute distress.  Skin: Skin is warm and dry. No rash noted.   Cardiovascular: Normal heart rate noted  Respiratory: Normal respiratory effort, no problems with respiration noted  Abdomen: Soft, gravid, appropriate for gestational age. Pain/Pressure: Present     Pelvic:  Cervical exam performed        Extremities: Normal range of motion.  Edema: None  Mental Status: Normal mood and affect. Normal behavior. Normal judgment and thought content.   Urinalysis:      Assessment and Plan:  Pregnancy: G3P2002 at [redacted]w[redacted]d  1. Supervision of other normal pregnancy, antepartum Stable Labor precautions  2. Breech presentation, single or unspecified fetus Management options reviewed. Will schedule for ECV  Term labor symptoms and general obstetric precautions including but not limited to vaginal bleeding, contractions, leaking of fluid and fetal movement were reviewed in detail with the patient. Please refer to After Visit Summary for other counseling recommendations.  Return in about 1  week (around 02/21/2019) for OB visit, face to face, MD provider.   Hermina Staggers, MD

## 2019-02-14 NOTE — Telephone Encounter (Signed)
Preadmission screen  

## 2019-02-14 NOTE — Progress Notes (Signed)
ROB.  C/o pelvic pressure and pain 8/10 x 1 month.  Irregular  contractions in lower back started this morning

## 2019-02-15 ENCOUNTER — Inpatient Hospital Stay (HOSPITAL_COMMUNITY): Payer: Medicaid Other

## 2019-02-15 ENCOUNTER — Ambulatory Visit (HOSPITAL_COMMUNITY)
Admission: AD | Admit: 2019-02-15 | Discharge: 2019-02-15 | Disposition: A | Discharge status: Home Health | Payer: Medicaid Other | Attending: Family Medicine | Admitting: Family Medicine

## 2019-02-15 ENCOUNTER — Encounter (HOSPITAL_COMMUNITY): Payer: Self-pay | Admitting: Family Medicine

## 2019-02-15 DIAGNOSIS — O321XX0 Maternal care for breech presentation, not applicable or unspecified: Secondary | ICD-10-CM | POA: Insufficient documentation

## 2019-02-15 DIAGNOSIS — Z3A37 37 weeks gestation of pregnancy: Secondary | ICD-10-CM | POA: Diagnosis not present

## 2019-02-15 DIAGNOSIS — O329XX0 Maternal care for malpresentation of fetus, unspecified, not applicable or unspecified: Secondary | ICD-10-CM | POA: Diagnosis present

## 2019-02-15 LAB — TYPE AND SCREEN
ABO/RH(D): O POS
Antibody Screen: NEGATIVE

## 2019-02-15 LAB — ABO/RH: ABO/RH(D): O POS

## 2019-02-15 MED ORDER — LACTATED RINGERS IV SOLN: INTRAVENOUS | Status: DC

## 2019-02-15 MED ORDER — TERBUTALINE SULFATE 1 MG/ML IJ SOLN
0.2500 mg | Freq: Once | INTRAMUSCULAR | Status: AC
  Administered 2019-02-15: 08:00:00 0.25 mg via SUBCUTANEOUS
  Filled 2019-02-15: qty 1

## 2019-02-15 NOTE — Progress Notes (Signed)
Patient ID: Sue Cruz, female   DOB: 08-Jun-1990, 29 y.o.   MRN: [id number] After informed verbal consent, Terbutaline 0.25 mg SQ given, ECV was attempted under Ultrasound guidance.  Forward roll x 3. The baby would not move as it is frank breech.   FHR was reactive before and after the procedure.   Pt. Tolerated the procedure well.

## 2019-02-15 NOTE — Discharge Instructions (Signed)
Breech Birth A breech birth is when a baby is born with the buttocks or feet first. Most babies are in a head down (vertex) position when they are born. There are three types of breech babies:  When the baby's buttocks are showing first in the vagina (birth canal) with the legs bent at the knees and the feet down near the buttocks (complete breech).  When the baby's buttocks are showing first in the birth canal with the legs straight up and the feet at the baby's head (frank breech).  When one or both of the baby's feet are showing first in the birth canal along with the buttocks (footling breech). What are the health risks of having a breech birth? Having a breech birth increases the health risks to your baby. A breech birth may cause the following:  Umbilical cord prolapse. This is when the umbilical cord enters the birth canal ahead of the baby, before or during labor. This can cause the cord to become pinched or compressed as labor continues. This can reduce the flow of blood and oxygen to the baby.  The baby getting stuck in the birth canal, which can cause injury or, rarely, death.  Injury to the baby's nerves in the shoulder, arm, and hand (brachial plexus injury) when delivered. What increases the risk of having a breech baby? It is not known what causes your baby to be breech. However, you are more likely to have a breech baby if:  You have had a previous pregnancy.  You are having more than one baby.  Your baby has certain birth (congenital) defects.  You have started your labor earlier than expected (premature labor).  You have problems with your uterus, such as a tumor or abnormally shaped uterus.  You have too much or not enough fluid surrounding the baby (amniotic fluid). How do I know if my baby is breech? There are no symptoms for you to know that your baby is breech. When you are close to your due date, your health care provider can tell if your baby is breech by  doing:  An abdominal or vaginal (pelvic) exam.  An ultrasound. What can be done if my baby is breech?  Your health care provider may try to turn the baby in your uterus. He or she will use a procedure called external cephalic version (ECV). He or she will place both hands on your abdomen and gently and slowly turn the baby around. It is important to know that ECV can increase your chances of suddenly going into labor. For this reason, an ECV is only done toward the end of a healthy pregnancy. The baby may remain in this position, but sometimes he or she may turn back to the breech position. You and your health care provider will discuss if an ECV is recommended for you and your baby. How will I delivery my baby if he or she is breech? You and your health care provider will discuss the best way to deliver your baby. If your baby is breech, it is less likely that a vaginal delivery will be recommended due to the risks to you and your baby. Your health care provider may recommend that you deliver your baby through a Cesarean section (C-section). A C-section is the surgical delivery of a baby through an incision in the abdomen and the uterus. Summary  A breech birth is when a baby is born with the buttocks or feet first.  Having a breech birth may   increase the risks to your baby.  Your health care provider may try to turn your baby in your uterus using a procedure called an external cephalic version (ECV).  If your baby cannot be turned to a head down position or if your baby remains in a breech position, your health care provider will make recommendations about the safest way to deliver your baby. This information is not intended to replace advice given to you by your health care provider. Make sure you discuss any questions you have with your health care provider. Document Revised: 01/07/2017 Document Reviewed: 10/21/2016 Elsevier Patient Education  2020 Elsevier Inc.  

## 2019-02-15 NOTE — H&P (Signed)
  Sue Cruz is an 29 y.o. G3P2002 [id number].   Chief Complaint: Breech HPI: here for ECV at term.  Past Medical History:  Diagnosis Date  . Medical history non-contributory     Past Surgical History:  Procedure Laterality Date  . WISDOM TOOTH EXTRACTION      History reviewed. No pertinent family history. Social History:  reports that she has never smoked. She has never used smokeless tobacco. She reports that she does not drink alcohol or use drugs.   No Known Allergies  Medications Prior to Admission  Medication Sig Dispense Refill  . acetaminophen (TYLENOL) 500 MG tablet Take 500 mg by mouth every 6 (six) hours as needed.    . Blood Pressure Monitor KIT 1 kit by Does not apply route once a week. Check BP Weekly.  Large Cuff  DX: Z13.6        Z34.86 1 kit 0  . cyclobenzaprine (FLEXERIL) 5 MG tablet Take 1 tablet (5 mg total) by mouth 3 (three) times daily as needed for muscle spasms. 30 tablet 0  . Doxylamine-Pyridoxine (DICLEGIS) 10-10 MG TBEC Take 2 tablets at bedtime and one in the morning and one in the afternoon as needed for nausea. 60 tablet 2  . Elastic Bandages & Supports (COMFORT FIT MATERNITY SUPP SM) MISC Wear as directed. 1 each 0  . Prenatal Vit-Fe Fumarate-FA (PRENATAL MULTIVITAMIN) TABS tablet Take 1 tablet by mouth daily at 12 noon.       Pertinent items are noted in HPI.  Height 5' (1.524 m), weight 88.1 kg, last menstrual period 05/29/2018. General appearance: alert, cooperative and appears stated age Head: Normocephalic, without obvious abnormality, atraumatic Neck: supple, symmetrical, trachea midline Lungs: normal effort Heart: regular rate and rhythm Abdomen: gravid, NT Extremities: Homans sign is negative, no sign of DVT Skin: Skin color, texture, turgor normal. No rashes or lesions Neurologic: Grossly normal   Lab Results  Component Value Date   WBC 9.5 12/05/2018   HGB 10.7 (L) 12/05/2018   HCT 32.6 (L) 12/05/2018   MCV 83  12/05/2018   PLT 308 12/05/2018         ABO, Rh: O/Positive/-- (07/06 0944)  Antibody: Negative (07/06 0944)  Rubella: 1.67 (07/06 0944)  RPR: Non Reactive (10/27 0904)  HBsAg: Negative (07/06 0944)  HIV: Non Reactive (10/27 0904)  GBS: --/[place](12/29 0142)     Assessment/Plan Principal Problem:   Malpresentation of fetus  For ECV--risks reviewed  TDonnamae Jude1/08/2019, 8:18 AM

## 2019-02-21 ENCOUNTER — Other Ambulatory Visit: Payer: Self-pay

## 2019-02-21 ENCOUNTER — Ambulatory Visit (INDEPENDENT_AMBULATORY_CARE_PROVIDER_SITE_OTHER): Payer: Medicaid Other | Admitting: Obstetrics & Gynecology

## 2019-02-21 VITALS — BP 134/84 | HR 92 | Wt 196.0 lb

## 2019-02-21 DIAGNOSIS — O321XX0 Maternal care for breech presentation, not applicable or unspecified: Secondary | ICD-10-CM

## 2019-02-21 DIAGNOSIS — Z3480 Encounter for supervision of other normal pregnancy, unspecified trimester: Secondary | ICD-10-CM

## 2019-02-21 DIAGNOSIS — Z3A38 38 weeks gestation of pregnancy: Secondary | ICD-10-CM

## 2019-02-21 NOTE — Progress Notes (Signed)
   PRENATAL VISIT NOTE  Subjective:  Sue Cruz is a 29 y.o. G3P2002 at [redacted]w[redacted]d being seen today for ongoing prenatal care.  She is currently monitored for the following issues for this high-risk pregnancy and has Supervision of other normal pregnancy, antepartum; Nausea and vomiting during pregnancy; and Malpresentation of fetus on their problem list.  Patient reports no complaints.  Contractions: Irregular. Vag. Bleeding: None.  Movement: Present. Denies leaking of fluid.   The following portions of the patient's history were reviewed and updated as appropriate: allergies, current medications, past family history, past medical history, past social history, past surgical history and problem list.   Objective:   Vitals:   02/21/19 1454  BP: 134/84  Pulse: 92  Weight: 196 lb (88.9 kg)    Fetal Status: Fetal Heart Rate (bpm): 160   Movement: Present     General:  Alert, oriented and cooperative. Patient is in no acute distress.  Skin: Skin is warm and dry. No rash noted.   Cardiovascular: Normal heart rate noted  Respiratory: Normal respiratory effort, no problems with respiration noted  Abdomen: Soft, gravid, appropriate for gestational age.  Pain/Pressure: Present     Pelvic: Cervical exam performed        Extremities: Normal range of motion.  Edema: None  Mental Status: Normal mood and affect. Normal behavior. Normal judgment and thought content.   Assessment and Plan:  Pregnancy: G3P2002 at [redacted]w[redacted]d There are no diagnoses linked to this encounter. Term labor symptoms and general obstetric precautions including but not limited to vaginal bleeding, contractions, leaking of fluid and fetal movement were reviewed in detail with the patient. Please refer to After Visit Summary for other counseling recommendations.  S/p failed ECV, breech confirmed by [place] today. Plan CS at 39 weeks for breech 02/26/19 Return in about 1 week (around 02/28/2019).   Future Appointments  Date Time Provider  Department Center  02/28/2019 11:15 AM Fair, Hoyle Sauer, MD [facility] None    Scheryl Darter, MD

## 2019-02-21 NOTE — Patient Instructions (Signed)
Cesarean Delivery Cesarean birth, or cesarean delivery, is the surgical delivery of a baby through an incision in the abdomen and the uterus. This may be referred to as a C-section. This procedure may be scheduled ahead of time, or it may be done in an emergency situation. Tell a health care provider about:  Any allergies you have.  All medicines you are taking, including vitamins, herbs, eye drops, creams, and over-the-counter medicines.  Any problems you or family members have had with anesthetic medicines.  Any blood disorders you have.  Any surgeries you have had.  Any medical conditions you have.  Whether you or any members of your family have a history of deep vein thrombosis (DVT) or pulmonary embolism (PE). What are the risks? Generally, this is a safe procedure. However, problems may occur, including:  Infection.  Bleeding.  Allergic reactions to medicines.  Damage to other structures or organs.  Blood clots.  Injury to your baby. What happens before the procedure? General instructions  Follow instructions from your health care provider about eating or drinking restrictions.  If you know that you are going to have a cesarean delivery, do not shave your pubic area. Shaving before the procedure may increase your risk of infection.  Plan to have someone take you home from the hospital.  Ask your health care provider what steps will be taken to prevent infection. These may include: ? Removing hair at the surgery site. ? Washing skin with a germ-killing soap. ? Taking antibiotic medicine.  Depending on the reason for your cesarean delivery, you may have a physical exam or additional testing, such as an ultrasound.  You may have your blood or urine tested. Questions for your health care provider  Ask your health care provider about: ? Changing or stopping your regular medicines. This is especially important if you are taking diabetes medicines or blood  thinners. ? Your pain management plan. This is especially important if you plan to breastfeed your baby. ? How long you will be in the hospital after the procedure. ? Any concerns you may have about receiving blood products, if you need them during the procedure. ? Cord blood banking, if you plan to collect your baby's umbilical cord blood.  You may also want to ask your health care provider: ? Whether you will be able to hold or breastfeed your baby while you are still in the operating room. ? Whether your baby can stay with you immediately after the procedure and during your recovery. ? Whether a family member or a person of your choice can go with you into the operating room and stay with you during the procedure, immediately after the procedure, and during your recovery. What happens during the procedure?   An IV will be inserted into one of your veins.  Fluid and medicines, such as antibiotics, will be given before the surgery.  Fetal monitors will be placed on your abdomen to check your baby's heart rate.  You may be given a special warming gown to wear to keep your temperature stable.  A catheter may be inserted into your bladder through your urethra. This drains your urine during the procedure.  You may be given one or more of the following: ? A medicine to numb the area (local anesthetic). ? A medicine to make you fall asleep (general anesthetic). ? A medicine (regional anesthetic) that is injected into your back or through a small thin tube placed in your back (spinal anesthetic or epidural anesthetic).   This numbs everything below the injection site and allows you to stay awake during your procedure. If this makes you feel nauseous, tell your health care provider. Medicines will be available to help reduce any nausea you may feel.  An incision will be made in your abdomen, and then in your uterus.  If you are awake during your procedure, you may feel tugging and pulling in  your abdomen, but you should not feel pain. If you feel pain, tell your health care provider immediately.  Your baby will be removed from your uterus. You may feel more pressure or pushing while this happens.  Immediately after birth, your baby will be dried and kept warm. You may be able to hold and breastfeed your baby.  The umbilical cord may be clamped and cut during this time. This usually occurs after waiting a period of 1-2 minutes after delivery.  Your placenta will be removed from your uterus.  Your incisions will be closed with stitches (sutures). Staples, skin glue, or adhesive strips may also be applied to the incision in your abdomen.  Bandages (dressings) may be placed over the incision in your abdomen. The procedure may vary among health care providers and hospitals. What happens after the procedure?  Your blood pressure, heart rate, breathing rate, and blood oxygen level will be monitored until you are discharged from the hospital.  You may continue to receive fluids and medicines through an IV.  You will have some pain. Medicines will be available to help control your pain.  To help prevent blood clots: ? You may be given medicines. ? You may have to wear compression stockings or devices. ? You will be encouraged to walk around when you are able.  Hospital staff will encourage and support bonding with your baby. Your hospital may have you and your baby to stay in the same room (rooming in) during your hospital stay to encourage successful bonding and breastfeeding.  You may be encouraged to cough and breathe deeply often. This helps to prevent lung problems.  If you have a catheter draining your urine, it will be removed as soon as possible after your procedure. Summary  Cesarean birth, or cesarean delivery, is the surgical delivery of a baby through an incision in the abdomen and the uterus.  Follow instructions from your health care provider about eating or  drinking restrictions before the procedure.  You will have some pain after the procedure. Medicines will be available to help control your pain.  Hospital staff will encourage and support bonding with your baby after the procedure. Your hospital may have you and your baby to stay in the same room (rooming in) during your hospital stay to encourage successful bonding and breastfeeding. This information is not intended to replace advice given to you by your health care provider. Make sure you discuss any questions you have with your health care provider. Document Revised: 08/01/2017 Document Reviewed: 08/01/2017 Elsevier Patient Education  2020 Elsevier Inc.  

## 2019-02-22 ENCOUNTER — Other Ambulatory Visit: Payer: Self-pay | Admitting: Family Medicine

## 2019-02-22 ENCOUNTER — Encounter: Payer: Self-pay | Admitting: *Deleted

## 2019-02-24 ENCOUNTER — Other Ambulatory Visit (HOSPITAL_COMMUNITY)
Admission: RE | Admit: 2019-02-24 | Discharge: 2019-02-24 | Disposition: A | Discharge status: Home / Self Care | Payer: Medicaid Other | Source: Ambulatory Visit | Attending: Family Medicine | Admitting: Family Medicine

## 2019-02-24 DIAGNOSIS — Z01812 Encounter for preprocedural laboratory examination: Secondary | ICD-10-CM | POA: Diagnosis not present

## 2019-02-24 DIAGNOSIS — Z20822 Contact with and (suspected) exposure to covid-19: Secondary | ICD-10-CM | POA: Insufficient documentation

## 2019-02-24 LAB — SARS CORONAVIRUS 2 (TAT 6-24 HRS): SARS Coronavirus 2: NEGATIVE

## 2019-02-26 ENCOUNTER — Inpatient Hospital Stay (HOSPITAL_COMMUNITY)
Admission: RE | Admit: 2019-02-26 | Discharge: 2019-02-26 | Disposition: A | Discharge status: Home / Self Care | Payer: Medicaid Other | Source: Ambulatory Visit

## 2019-02-26 ENCOUNTER — Encounter (HOSPITAL_COMMUNITY): Payer: Self-pay

## 2019-02-26 NOTE — Patient Instructions (Signed)
SHELA ESSES  02/26/2019   Your procedure is scheduled on:  02/27/2019  Arrive at 1100 at Entrance C on [address] at [facility]  and [employer]. You are invited to use the FREE valet parking or use the Visitor's parking deck.  Pick up the phone at the desk and dial [phone].  Call this number if you have problems the morning of surgery: [phone]  Remember:   Do not eat food:(After Midnight) Desps de medianoche.  Do not drink clear liquids: (After Midnight) Desps de medianoche.  Take these medicines the morning of surgery with A SIP OF WATER:  none   Do not wear jewelry, make-up or nail polish.  Do not wear lotions, powders, or perfumes. Do not wear deodorant.  Do not shave 48 hours prior to surgery.  Do not bring valuables to the hospital.  [facility] is not   responsible for any belongings or valuables brought to the hospital.  Contacts, dentures or bridgework may not be worn into surgery.  Leave suitcase in the car. After surgery it may be brought to your room.  For patients admitted to the hospital, checkout time is 11:00 AM the day of              discharge.      Please read over the following fact sheets that you were given:     Preparing for Surgery

## 2019-02-27 ENCOUNTER — Inpatient Hospital Stay (HOSPITAL_COMMUNITY): Payer: Medicaid Other | Admitting: Anesthesiology

## 2019-02-27 ENCOUNTER — Other Ambulatory Visit: Payer: Self-pay

## 2019-02-27 ENCOUNTER — Inpatient Hospital Stay (HOSPITAL_COMMUNITY)
Admission: RE | Admit: 2019-02-27 | Discharge: 2019-03-01 | DRG: 788 | Disposition: A | Discharge status: Home / Self Care | Payer: Medicaid Other | Attending: Family Medicine | Admitting: Family Medicine

## 2019-02-27 ENCOUNTER — Encounter (HOSPITAL_COMMUNITY)
Admission: RE | Disposition: A | Discharge status: Home / Self Care | Payer: Self-pay | Source: Home / Self Care | Attending: Family Medicine

## 2019-02-27 ENCOUNTER — Encounter (HOSPITAL_COMMUNITY): Payer: Self-pay | Admitting: Family Medicine

## 2019-02-27 DIAGNOSIS — Z30017 Encounter for initial prescription of implantable subdermal contraceptive: Secondary | ICD-10-CM | POA: Diagnosis not present

## 2019-02-27 DIAGNOSIS — Z20822 Contact with and (suspected) exposure to covid-19: Secondary | ICD-10-CM | POA: Diagnosis present

## 2019-02-27 DIAGNOSIS — O99214 Obesity complicating childbirth: Secondary | ICD-10-CM | POA: Diagnosis present

## 2019-02-27 DIAGNOSIS — Z3A39 39 weeks gestation of pregnancy: Secondary | ICD-10-CM | POA: Diagnosis not present

## 2019-02-27 DIAGNOSIS — O321XX0 Maternal care for breech presentation, not applicable or unspecified: Secondary | ICD-10-CM | POA: Diagnosis not present

## 2019-02-27 DIAGNOSIS — O329XX0 Maternal care for malpresentation of fetus, unspecified, not applicable or unspecified: Secondary | ICD-10-CM | POA: Diagnosis present

## 2019-02-27 DIAGNOSIS — O328XX0 Maternal care for other malpresentation of fetus, not applicable or unspecified: Secondary | ICD-10-CM | POA: Diagnosis not present

## 2019-02-27 DIAGNOSIS — E669 Obesity, unspecified: Secondary | ICD-10-CM | POA: Diagnosis not present

## 2019-02-27 DIAGNOSIS — Z3480 Encounter for supervision of other normal pregnancy, unspecified trimester: Secondary | ICD-10-CM

## 2019-02-27 LAB — CBC
HCT: 42.2 % (ref 36.0–46.0)
Hemoglobin: 13.8 g/dL (ref 12.0–15.0)
MCH: 27.6 pg (ref 26.0–34.0)
MCHC: 32.7 g/dL (ref 30.0–36.0)
MCV: 84.4 fL (ref 80.0–100.0)
Platelets: 286 10*3/uL (ref 150–400)
RBC: 5 MIL/uL (ref 3.87–5.11)
RDW: 14.8 % (ref 11.5–15.5)
WBC: 8.3 10*3/uL (ref 4.0–10.5)
nRBC: 0 % (ref 0.0–0.2)

## 2019-02-27 LAB — RAPID HIV SCREEN (HIV 1/2 AB+AG)
HIV 1/2 Antibodies: NONREACTIVE
HIV-1 P24 Antigen - HIV24: NONREACTIVE

## 2019-02-27 SURGERY — Surgical Case
Anesthesia: Spinal | Site: Abdomen | Wound class: Clean Contaminated

## 2019-02-27 MED ORDER — HYDROCODONE-ACETAMINOPHEN 5-325 MG PO TABS
1.0000 | ORAL_TABLET | ORAL | Status: DC | PRN
  Administered 2019-02-28: 1 via ORAL
  Filled 2019-02-27: qty 1

## 2019-02-27 MED ORDER — FENTANYL CITRATE (PF) 100 MCG/2ML IJ SOLN
INTRAMUSCULAR | Status: AC
  Filled 2019-02-27: qty 2

## 2019-02-27 MED ORDER — SIMETHICONE 80 MG PO CHEW: 80.0000 mg | CHEWABLE_TABLET | ORAL | Status: DC | PRN

## 2019-02-27 MED ORDER — KETOROLAC TROMETHAMINE 30 MG/ML IJ SOLN
30.0000 mg | Freq: Four times a day (QID) | INTRAMUSCULAR | Status: AC | PRN
  Administered 2019-02-28: 06:00:00 30 mg via INTRAVENOUS

## 2019-02-27 MED ORDER — NALOXONE HCL 0.4 MG/ML IJ SOLN: 0.4000 mg | INTRAMUSCULAR | Status: DC | PRN

## 2019-02-27 MED ORDER — NALBUPHINE HCL 10 MG/ML IJ SOLN: 5.0000 mg | Freq: Once | INTRAMUSCULAR | Status: DC | PRN

## 2019-02-27 MED ORDER — CEFAZOLIN SODIUM-DEXTROSE 2-4 GM/100ML-% IV SOLN
INTRAVENOUS | Status: AC
  Filled 2019-02-27: qty 100

## 2019-02-27 MED ORDER — OXYCODONE HCL 5 MG PO TABS: 5.0000 mg | ORAL_TABLET | Freq: Once | ORAL | Status: DC | PRN

## 2019-02-27 MED ORDER — ONDANSETRON HCL 4 MG/2ML IJ SOLN
INTRAMUSCULAR | Status: DC | PRN
  Administered 2019-02-27: 4 mg via INTRAVENOUS

## 2019-02-27 MED ORDER — NALBUPHINE HCL 10 MG/ML IJ SOLN: 5.0000 mg | INTRAMUSCULAR | Status: DC | PRN

## 2019-02-27 MED ORDER — OXYTOCIN 40 UNITS IN NORMAL SALINE INFUSION - SIMPLE MED: 2.5000 [IU]/h | INTRAVENOUS | Status: AC

## 2019-02-27 MED ORDER — BUPIVACAINE IN DEXTROSE 0.75-8.25 % IT SOLN
INTRATHECAL | Status: DC | PRN
  Administered 2019-02-27: 1.6 mL via INTRATHECAL

## 2019-02-27 MED ORDER — SODIUM CHLORIDE 0.9% FLUSH: 3.0000 mL | INTRAVENOUS | Status: DC | PRN

## 2019-02-27 MED ORDER — KETOROLAC TROMETHAMINE 30 MG/ML IJ SOLN
30.0000 mg | Freq: Four times a day (QID) | INTRAMUSCULAR | Status: AC
  Administered 2019-02-27 – 2019-02-28 (×2): 30 mg via INTRAVENOUS
  Filled 2019-02-27 (×3): qty 1

## 2019-02-27 MED ORDER — CEFAZOLIN SODIUM-DEXTROSE 2-4 GM/100ML-% IV SOLN
2.0000 g | INTRAVENOUS | Status: AC
  Administered 2019-02-27: 14:00:00 2 g via INTRAVENOUS

## 2019-02-27 MED ORDER — ACETAMINOPHEN 325 MG PO TABS
650.0000 mg | ORAL_TABLET | Freq: Four times a day (QID) | ORAL | Status: DC | PRN
  Administered 2019-02-27 – 2019-02-28 (×2): 650 mg via ORAL
  Filled 2019-02-27 (×2): qty 2

## 2019-02-27 MED ORDER — MORPHINE SULFATE (PF) 0.5 MG/ML IJ SOLN
INTRAMUSCULAR | Status: DC | PRN
  Administered 2019-02-27: .15 mg via INTRATHECAL

## 2019-02-27 MED ORDER — SODIUM CHLORIDE 0.9 % IV SOLN: INTRAVENOUS | Status: DC | PRN

## 2019-02-27 MED ORDER — SENNOSIDES-DOCUSATE SODIUM 8.6-50 MG PO TABS
2.0000 | ORAL_TABLET | ORAL | Status: DC
  Administered 2019-02-27 – 2019-02-28 (×2): 2 via ORAL
  Filled 2019-02-27 (×2): qty 2

## 2019-02-27 MED ORDER — DIPHENHYDRAMINE HCL 25 MG PO CAPS: 25.0000 mg | ORAL_CAPSULE | Freq: Four times a day (QID) | ORAL | Status: DC | PRN

## 2019-02-27 MED ORDER — ONDANSETRON HCL 4 MG/2ML IJ SOLN
4.0000 mg | Freq: Three times a day (TID) | INTRAMUSCULAR | Status: DC | PRN
  Administered 2019-02-27: 22:00:00 4 mg via INTRAVENOUS
  Filled 2019-02-27: qty 2

## 2019-02-27 MED ORDER — KETOROLAC TROMETHAMINE 30 MG/ML IJ SOLN
INTRAMUSCULAR | Status: AC
  Filled 2019-02-27: qty 1

## 2019-02-27 MED ORDER — COCONUT OIL OIL: 1.0000 "application " | TOPICAL_OIL | Status: DC | PRN

## 2019-02-27 MED ORDER — LACTATED RINGERS IV SOLN: INTRAVENOUS | Status: DC | PRN

## 2019-02-27 MED ORDER — FENTANYL CITRATE (PF) 100 MCG/2ML IJ SOLN
INTRAMUSCULAR | Status: DC | PRN
  Administered 2019-02-27: 15 ug via INTRATHECAL

## 2019-02-27 MED ORDER — SODIUM CHLORIDE 0.9 % IR SOLN
Status: DC | PRN
  Administered 2019-02-27: 1000 mL

## 2019-02-27 MED ORDER — SOD CITRATE-CITRIC ACID 500-334 MG/5ML PO SOLN
30.0000 mL | Freq: Once | ORAL | Status: AC
  Administered 2019-02-27: 14:00:00 30 mL via ORAL

## 2019-02-27 MED ORDER — ENOXAPARIN SODIUM 40 MG/0.4ML ~~LOC~~ SOLN
40.0000 mg | SUBCUTANEOUS | Status: DC
  Administered 2019-02-28 – 2019-03-01 (×2): 40 mg via SUBCUTANEOUS
  Filled 2019-02-27 (×2): qty 0.4

## 2019-02-27 MED ORDER — DIPHENHYDRAMINE HCL 25 MG PO CAPS: 25.0000 mg | ORAL_CAPSULE | ORAL | Status: DC | PRN

## 2019-02-27 MED ORDER — OXYTOCIN 40 UNITS IN NORMAL SALINE INFUSION - SIMPLE MED
INTRAVENOUS | Status: DC | PRN
  Administered 2019-02-27: 40 mL via INTRAVENOUS

## 2019-02-27 MED ORDER — SOD CITRATE-CITRIC ACID 500-334 MG/5ML PO SOLN
ORAL | Status: AC
  Filled 2019-02-27: qty 30

## 2019-02-27 MED ORDER — WITCH HAZEL-GLYCERIN EX PADS: 1.0000 "application " | MEDICATED_PAD | CUTANEOUS | Status: DC | PRN

## 2019-02-27 MED ORDER — PHENYLEPHRINE HCL-NACL 20-0.9 MG/250ML-% IV SOLN
INTRAVENOUS | Status: DC | PRN
  Administered 2019-02-27: 60 ug/min via INTRAVENOUS

## 2019-02-27 MED ORDER — FENTANYL CITRATE (PF) 100 MCG/2ML IJ SOLN: 25.0000 ug | INTRAMUSCULAR | Status: DC | PRN

## 2019-02-27 MED ORDER — STERILE WATER FOR IRRIGATION IR SOLN
Status: DC | PRN
  Administered 2019-02-27: 1000 mL

## 2019-02-27 MED ORDER — DIBUCAINE (PERIANAL) 1 % EX OINT: 1.0000 "application " | TOPICAL_OINTMENT | CUTANEOUS | Status: DC | PRN

## 2019-02-27 MED ORDER — FAMOTIDINE 20 MG PO TABS
20.0000 mg | ORAL_TABLET | Freq: Once | ORAL | Status: AC
  Administered 2019-02-27: 20 mg via ORAL

## 2019-02-27 MED ORDER — MENTHOL 3 MG MT LOZG: 1.0000 | LOZENGE | OROMUCOSAL | Status: DC | PRN

## 2019-02-27 MED ORDER — NALOXONE HCL 4 MG/10ML IJ SOLN
1.0000 ug/kg/h | INTRAVENOUS | Status: DC | PRN
  Filled 2019-02-27: qty 5

## 2019-02-27 MED ORDER — SCOPOLAMINE 1 MG/3DAYS TD PT72: 1.0000 | MEDICATED_PATCH | Freq: Once | TRANSDERMAL | Status: DC

## 2019-02-27 MED ORDER — PROMETHAZINE HCL 25 MG/ML IJ SOLN: 6.2500 mg | INTRAMUSCULAR | Status: DC | PRN

## 2019-02-27 MED ORDER — SCOPOLAMINE 1 MG/3DAYS TD PT72
MEDICATED_PATCH | TRANSDERMAL | Status: AC
  Filled 2019-02-27: qty 1

## 2019-02-27 MED ORDER — DIPHENHYDRAMINE HCL 50 MG/ML IJ SOLN: 12.5000 mg | INTRAMUSCULAR | Status: DC | PRN

## 2019-02-27 MED ORDER — MORPHINE SULFATE (PF) 0.5 MG/ML IJ SOLN
INTRAMUSCULAR | Status: AC
  Filled 2019-02-27: qty 10

## 2019-02-27 MED ORDER — SCOPOLAMINE 1 MG/3DAYS TD PT72
1.0000 | MEDICATED_PATCH | Freq: Once | TRANSDERMAL | Status: DC
  Administered 2019-02-27: 1.5 mg via TRANSDERMAL

## 2019-02-27 MED ORDER — LACTATED RINGERS IV SOLN: INTRAVENOUS | Status: DC

## 2019-02-27 MED ORDER — FAMOTIDINE 20 MG PO TABS
ORAL_TABLET | ORAL | Status: AC
  Filled 2019-02-27: qty 1

## 2019-02-27 MED ORDER — MEPERIDINE HCL 25 MG/ML IJ SOLN: 6.2500 mg | INTRAMUSCULAR | Status: DC | PRN

## 2019-02-27 MED ORDER — IBUPROFEN 800 MG PO TABS
800.0000 mg | ORAL_TABLET | Freq: Three times a day (TID) | ORAL | Status: DC
  Administered 2019-02-28 – 2019-03-01 (×3): 800 mg via ORAL
  Filled 2019-02-27 (×3): qty 1

## 2019-02-27 MED ORDER — SIMETHICONE 80 MG PO CHEW
80.0000 mg | CHEWABLE_TABLET | ORAL | Status: DC
  Administered 2019-02-27 – 2019-02-28 (×2): 80 mg via ORAL
  Filled 2019-02-27 (×2): qty 1

## 2019-02-27 MED ORDER — SIMETHICONE 80 MG PO CHEW
80.0000 mg | CHEWABLE_TABLET | Freq: Three times a day (TID) | ORAL | Status: DC
  Administered 2019-02-27 – 2019-03-01 (×5): 80 mg via ORAL
  Filled 2019-02-27 (×5): qty 1

## 2019-02-27 MED ORDER — PHENYLEPHRINE HCL-NACL 20-0.9 MG/250ML-% IV SOLN
INTRAVENOUS | Status: AC
  Filled 2019-02-27: qty 250

## 2019-02-27 MED ORDER — PRENATAL MULTIVITAMIN CH
1.0000 | ORAL_TABLET | Freq: Every day | ORAL | Status: DC
  Administered 2019-02-28: 11:00:00 1 via ORAL
  Filled 2019-02-27: qty 1

## 2019-02-27 MED ORDER — LACTATED RINGERS IV SOLN: 125.0000 mL/h | INTRAVENOUS | Status: DC

## 2019-02-27 MED ORDER — KETOROLAC TROMETHAMINE 30 MG/ML IJ SOLN
30.0000 mg | Freq: Four times a day (QID) | INTRAMUSCULAR | Status: AC | PRN
  Administered 2019-02-27: 30 mg via INTRAMUSCULAR

## 2019-02-27 MED ORDER — ONDANSETRON HCL 4 MG/2ML IJ SOLN
INTRAMUSCULAR | Status: AC
  Filled 2019-02-27: qty 2

## 2019-02-27 MED ORDER — OXYCODONE HCL 5 MG/5ML PO SOLN: 5.0000 mg | Freq: Once | ORAL | Status: DC | PRN

## 2019-02-27 MED ORDER — OXYTOCIN 40 UNITS IN NORMAL SALINE INFUSION - SIMPLE MED
INTRAVENOUS | Status: AC
  Filled 2019-02-27: qty 1000

## 2019-02-27 SURGICAL SUPPLY — 37 items
APL SKNCLS STERI-STRIP NONHPOA (GAUZE/BANDAGES/DRESSINGS) ×1
BENZOIN TINCTURE PRP APPL 2/3 (GAUZE/BANDAGES/DRESSINGS) ×3 IMPLANT
CHLORAPREP W/TINT 26ML (MISCELLANEOUS) ×3 IMPLANT
CLAMP CORD UMBIL (MISCELLANEOUS) IMPLANT
CLOSURE WOUND 1/2 X4 (GAUZE/BANDAGES/DRESSINGS) ×1
CLOTH BEACON ORANGE TIMEOUT ST (SAFETY) ×3 IMPLANT
DRSG OPSITE POSTOP 4X10 (GAUZE/BANDAGES/DRESSINGS) ×3 IMPLANT
ELECT REM PT RETURN 9FT ADLT (ELECTROSURGICAL) ×3
ELECTRODE REM PT RTRN 9FT ADLT (ELECTROSURGICAL) ×1 IMPLANT
EXTRACTOR VACUUM M CUP 4 TUBE (SUCTIONS) IMPLANT
EXTRACTOR VACUUM M CUP 4' TUBE (SUCTIONS)
GAUZE SPONGE 4X4 12PLY STRL LF (GAUZE/BANDAGES/DRESSINGS) ×4 IMPLANT
GLOVE BIOGEL PI IND STRL 7.0 (GLOVE) ×2 IMPLANT
GLOVE BIOGEL PI IND STRL 7.5 (GLOVE) ×2 IMPLANT
GLOVE BIOGEL PI INDICATOR 7.0 (GLOVE) ×4
GLOVE BIOGEL PI INDICATOR 7.5 (GLOVE) ×4
GLOVE ECLIPSE 7.5 STRL STRAW (GLOVE) ×3 IMPLANT
GOWN STRL REUS W/TWL LRG LVL3 (GOWN DISPOSABLE) ×9 IMPLANT
KIT ABG SYR 3ML LUER SLIP (SYRINGE) IMPLANT
NDL HYPO 25X5/8 SAFETYGLIDE (NEEDLE) IMPLANT
NEEDLE HYPO 25X5/8 SAFETYGLIDE (NEEDLE) IMPLANT
NS IRRIG 1000ML POUR BTL (IV SOLUTION) ×3 IMPLANT
PACK C SECTION WH (CUSTOM PROCEDURE TRAY) ×3 IMPLANT
PAD ABD 8X7 1/2 STERILE (GAUZE/BANDAGES/DRESSINGS) ×2 IMPLANT
PAD OB MATERNITY 4.3X12.25 (PERSONAL CARE ITEMS) ×3 IMPLANT
PENCIL SMOKE EVAC W/HOLSTER (ELECTROSURGICAL) ×3 IMPLANT
RTRCTR C-SECT PINK 25CM LRG (MISCELLANEOUS) ×3 IMPLANT
STRIP CLOSURE SKIN 1/2X4 (GAUZE/BANDAGES/DRESSINGS) ×2 IMPLANT
SUT VIC AB 0 CTX 36 (SUTURE) ×9
SUT VIC AB 0 CTX36XBRD ANBCTRL (SUTURE) ×3 IMPLANT
SUT VIC AB 2-0 CT1 27 (SUTURE) ×3
SUT VIC AB 2-0 CT1 TAPERPNT 27 (SUTURE) ×1 IMPLANT
SUT VIC AB 4-0 KS 27 (SUTURE) ×3 IMPLANT
TAPE CLOTH SURG 4X10 WHT LF (GAUZE/BANDAGES/DRESSINGS) ×2 IMPLANT
TOWEL OR 17X24 6PK STRL BLUE (TOWEL DISPOSABLE) ×3 IMPLANT
TRAY FOLEY W/BAG SLVR 14FR LF (SET/KITS/TRAYS/PACK) ×3 IMPLANT
WATER STERILE IRR 1000ML POUR (IV SOLUTION) ×3 IMPLANT

## 2019-02-27 NOTE — Anesthesia Postprocedure Evaluation (Signed)
Anesthesia Post Note  Patient: Sue Cruz  Procedure(s) Performed: CESAREAN SECTION (N/A Abdomen)     Patient location during evaluation: PACU Anesthesia Type: Spinal Level of consciousness: awake and alert Pain management: pain level controlled Vital Signs Assessment: post-procedure vital signs reviewed and stable Respiratory status: spontaneous breathing and respiratory function stable Cardiovascular status: blood pressure returned to baseline and stable Postop Assessment: spinal receding and no apparent nausea or vomiting Anesthetic complications: no    Last Vitals:  Vitals:   02/27/19 1630 02/27/19 1646  BP: (!) 112/57 104/62  Pulse: 64 (!) 58  Resp: 20 18  Temp: (!) 36.4 C (!) 36.4 C  SpO2: 98% 99%    Last Pain:  Vitals:   02/27/19 1646  TempSrc: Oral  PainSc:    Pain Goal:    LLE Motor Response: Purposeful movement (02/27/19 1630)   RLE Motor Response: Purposeful movement (02/27/19 1630)       Epidural/Spinal Function Cutaneous sensation: Able to Discern Pressure (02/27/19 1630), Patient able to flex knees: No (02/27/19 1630), Patient able to lift hips off bed: No (02/27/19 1630), Back pain beyond tenderness at insertion site: No (02/27/19 1630), Progressively worsening motor and/or sensory loss: No (02/27/19 1630), Bowel and/or bladder incontinence post epidural: No (02/27/19 1630)  Beryle Lathe

## 2019-02-27 NOTE — Anesthesia Procedure Notes (Signed)
Spinal  Patient location during procedure: OR Start time: 02/27/2019 2:22 PM End time: 02/27/2019 2:28 PM Staffing Performed: anesthesiologist  Anesthesiologist: Beryle Lathe, MD Preanesthetic Checklist Completed: patient identified, IV checked, risks and benefits discussed, surgical consent, monitors and equipment checked, pre-op evaluation and timeout performed Spinal Block Patient position: sitting Prep: DuraPrep Patient monitoring: heart rate, cardiac monitor, continuous pulse ox and blood pressure Approach: midline Location: L3-4 Injection technique: single-shot Needle Needle type: Quincke  Needle gauge: 22 G Additional Notes Consent was obtained prior to the procedure with all questions answered and concerns addressed. Risks including, but not limited to, bleeding, infection, nerve damage, paralysis, failed block, inadequate analgesia, allergic reaction, high spinal, itching, and headache were discussed and the patient wished to proceed. Functioning IV was confirmed and monitors were applied. Sterile prep and drape, including hand hygiene, mask, and sterile gloves were used. The patient was positioned and the spine was prepped. The skin was anesthetized with lidocaine. Free flow of clear CSF was obtained prior to injecting local anesthetic into the CSF. The spinal needle aspirated freely following injection. The needle was carefully withdrawn. The patient tolerated the procedure well.   Leslye Peer, MD

## 2019-02-27 NOTE — Progress Notes (Signed)
Pt in moderate inc pain. Called MD for Tylenol order but had to leave a message as all doctors were tied up in the OR,. Explained situation to the pt and offerred her cold therapy for now because heat therapy didn't help. Emotional support given.

## 2019-02-27 NOTE — Anesthesia Preprocedure Evaluation (Addendum)
Anesthesia Evaluation  Patient identified by MRN, date of birth, ID band Patient awake    Reviewed: Allergy & Precautions, NPO status , Patient's Chart, lab work & pertinent test results  History of Anesthesia Complications Negative for: history of anesthetic complications  Airway Mallampati: I  TM Distance: >3 FB Neck ROM: Full    Dental  (+) Dental Advisory Given, Teeth Intact   Pulmonary neg pulmonary ROS,    Pulmonary exam normal        Cardiovascular negative cardio ROS Normal cardiovascular exam     Neuro/Psych negative neurological ROS  negative psych ROS   GI/Hepatic negative GI ROS, Neg liver ROS,   Endo/Other   Obesity   Renal/GU negative Renal ROS     Musculoskeletal negative musculoskeletal ROS (+)   Abdominal (+) + obese,   Peds  Hematology negative hematology ROS (+)   Anesthesia Other Findings Covid neg 1/16  Reproductive/Obstetrics (+) Pregnancy                            Anesthesia Physical Anesthesia Plan  ASA: II  Anesthesia Plan: Spinal   Post-op Pain Management:    Induction:   PONV Risk Score and Plan: 2 and Treatment may vary due to age or medical condition, Ondansetron and Scopolamine patch - Pre-op  Airway Management Planned: Natural Airway  Additional Equipment: None  Intra-op Plan:   Post-operative Plan:   Informed Consent: I have reviewed the patients History and Physical, chart, labs and discussed the procedure including the risks, benefits and alternatives for the proposed anesthesia with the patient or authorized representative who has indicated his/her understanding and acceptance.       Plan Discussed with: CRNA and Anesthesiologist  Anesthesia Plan Comments: (Labs reviewed, platelets acceptable. Discussed risks and benefits of spinal, including spinal/epidural hematoma, infection, failed block, and PDPH. Patient expressed  understanding and wished to proceed. )       Anesthesia Quick Evaluation

## 2019-02-27 NOTE — Discharge Summary (Signed)
Postpartum Discharge Summary      Patient Name: Sue Cruz DOB: 06/07/1990 MRN: [id number]  Date of admission: 02/27/2019 Delivering Provider: Truett Mainland   Date of discharge: 03/01/2019  Admitting diagnosis: Cesarean delivery delivered [O82] Intrauterine pregnancy: [redacted]w[redacted]d    Secondary diagnosis:  Active Problems:   Supervision of other normal pregnancy, antepartum   Malpresentation of fetus   Cesarean delivery delivered  Additional problems: None     Discharge diagnosis: Term Pregnancy Delivered                                                                                                Post partum procedures: Nexplanon  Augmentation: n/a  Complications: None  Hospital course:  [name]   29y.o. yo G3P3003 at 371w1das admitted to the hospital 02/27/2019 for scheduled cesarean section with the following indication:Malpresentation.  Membrane Rupture Time/Date: 2:43 PM ,02/27/2019   Patient delivered a Viable infant.02/27/2019  Details of operation can be found in separate operative note.  [name] had an uncomplicated postpartum course.  She is ambulating, tolerating a regular diet, passing flatus, and urinating well. Patient is discharged home in stable condition on  03/01/19        Delivery time: 2:50 PM    Magnesium Sulfate received: No BMZ received: No Rhophylac:N/A MMR:N/A Transfusion:No  Physical exam  Vitals:   02/28/19 0612 02/28/19 1527 02/28/19 2221 03/01/19 0507  BP: (!) 98/57 (!) 96/53 (!) 95/54 (!) 108/52  Pulse: 65 64 73 63  Resp:  '16 16 16  '$ Temp: 98.1 F (36.7 C) (!) 97.5 F (36.4 C) 97.7 F (36.5 C) 97.8 F (36.6 C)  TempSrc: Oral Oral Oral Oral  SpO2: 98%  99%   Weight:      Height:       General: alert, cooperative and no distress Lochia: appropriate Uterine Fundus: firm Incision: Dressing is clean, dry, and intact DVT Evaluation: No evidence of DVT seen on physical exam. Labs: Lab Results  Component Value Date   WBC 7.7  02/28/2019   HGB 10.3 (L) 02/28/2019   HCT 31.8 (L) 02/28/2019   MCV 86.6 02/28/2019   PLT 216 02/28/2019   CMP Latest Ref Rng & Units 08/14/2018  Glucose 65 - 99 mg/dL 85  BUN 6 - 20 mg/dL 6  Creatinine 0.57 - 1.00 mg/dL 0.54(L)  Sodium 134 - 144 mmol/L 138  Potassium 3.5 - 5.2 mmol/L 4.0  Chloride 96 - 106 mmol/L 100  CO2 20 - 29 mmol/L 21  Calcium 8.7 - 10.2 mg/dL 9.4  Total Protein 6.0 - 8.5 g/dL 6.7  Total Bilirubin 0.0 - 1.2 mg/dL 0.4  Alkaline Phos 39 - 117 IU/L 54  AST 0 - 40 IU/L 14  ALT 0 - 32 IU/L 10    Discharge instruction: per After Visit Summary and "Baby and Me Booklet".  After visit meds:  Allergies as of 03/01/2019   No Known Allergies     Medication List    TAKE these medications   acetaminophen 325 MG tablet Commonly known as: TYLENOL Take 2 tablets (650 mg total) by  mouth every 6 (six) hours as needed for mild pain or moderate pain. What changed:   medication strength  how much to take  reasons to take this   Blood Pressure Monitor Kit 1 kit by Does not apply route once a week. Check BP Weekly.  Large Cuff  DX: Z13.6        Z34.86   Terra Bella Sm Misc Wear as directed.   cyclobenzaprine 5 MG tablet Commonly known as: FLEXERIL Take 1 tablet (5 mg total) by mouth 3 (three) times daily as needed for muscle spasms.   Doxylamine-Pyridoxine 10-10 MG Tbec Commonly known as: Diclegis Take 2 tablets at bedtime and one in the morning and one in the afternoon as needed for nausea.   ibuprofen 800 MG tablet Commonly known as: ADVIL Take 1 tablet (800 mg total) by mouth every 8 (eight) hours.   oxyCODONE 5 MG immediate release tablet Commonly known as: Roxicodone Take 1 tablet (5 mg total) by mouth every 4 (four) hours as needed for moderate pain or severe pain.   prenatal multivitamin Tabs tablet Take 1 tablet by mouth daily at 12 noon.       Diet: routine diet  Activity: Advance as tolerated. Pelvic rest for 6 weeks.    Outpatient follow up:6 weeks Follow up Appt: Future Appointments  Date Time Provider [place]  03/19/2019  9:30 AM [place] None  03/28/2019 10:00 AM [place] Mode, MD [facility] None   Follow up Visit:   Please schedule this patient for Postpartum visit in: 6 weeks with the following provider: Any provider Virtual For C/S patients schedule nurse incision check in weeks 2 weeks: yes Low risk pregnancy complicated by: breech presentation Delivery mode:  CS Anticipated Birth Control:  Nexplanon PP Procedures needed: Incision check  Schedule Integrated BH visit: no  Newborn Data: Live born female  Birth Weight:  3885g APGAR: 85, 9  Newborn Delivery   Birth date/time: 02/27/2019 14:50:00 Delivery type: C-Section, Low Transverse Trial of labor: No C-section categorization: Primary      Baby Feeding: Bottle and Breast Disposition:home with mother   03/01/2019 Merilyn Baba, DO

## 2019-02-27 NOTE — Lactation Note (Addendum)
This note was copied from a baby's chart. Lactation Consultation Note  Patient Name: Sue Cruz NTIRW'E Date: 02/27/2019 Reason for consult: Initial assessment;Difficult latch;Mother's request;Term P3, 6 hour female infant. Mom's feeding choice is breast and formula feeding. Mom is active on [facility] and she doesn't have breast pump at home. Infant had one void and one stool since birth. Per parents, this is infant first time latching at breast, they made two attempts earlier and infant would not latch. Mom hx: c/s delivery, inverted nipples and difficulties with latching previous to children they breastfeed less than 2 weeks. Tools given: hand pump to pre-pump breast, breast shells and 24 mm NS due to having inverted nipples. LC entered room mom had bottle of gerber formula on the counter but not open. Mom knows how to hand express and taught back expressing 4 mls of colostrum.. Infant was cuing to breastfeed, LC had mom pre-pump breast, extended nipple shaft and apply 24 mm NS that was pre-filled with  0.5 mm colostrum using curve tip syringe. Mom latched infant on right breast using the football hold, infant latched with wide mouth, nose and chin touching breast, infant sustained latch and was still breastfeeding after 23 minutes when LC left the room. Infant took 4 mls of colostrum at breast with 24 mm NS and curve tip syringe. Mom understands to wear breast shells in bra during the day and not sleep in them at night. Parents were pleased that infant latched at breast. Parents with continue to do STS as much as possible. Mom knows to call RN or LC if she has any further questions, concerns or need assistance with latching infant at breast. Parents have decided to delay offering formula at this time, mom will continue working with latching infant at breast and establishing her milk supply. Reviewed Baby & Me book's Breastfeeding Basics.  Mom made aware of O/P services,  breastfeeding support groups, community resources, and our phone # for post-discharge questions.  Maternal Data Formula Feeding for Exclusion: Yes Reason for exclusion: Mother's choice to formula and breast feed on admission Has patient been taught Hand Expression?: Yes Does the patient have breastfeeding experience prior to this delivery?: Yes  Feeding Feeding Type: Breast Fed  LATCH Score Latch: Grasps breast easily, tongue down, lips flanged, rhythmical sucking.  Audible Swallowing: Spontaneous and intermittent  Type of Nipple: Inverted  Comfort (Breast/Nipple): Soft / non-tender  Hold (Positioning): Assistance needed to correctly position infant at breast and maintain latch.  LATCH Score: 7  Interventions Interventions: Breast feeding basics reviewed;Breast compression;Adjust position;Assisted with latch;Skin to skin;Support pillows;Hand pump;Position options;Breast massage;Hand express;Expressed milk;Pre-pump if needed;Shells  Lactation Tools Discussed/Used Tools: Shells;Pump;Nipple Shields Nipple shield size: 24 Shell Type: Inverted Breast pump type: Manual WIC Program: Yes Pump Review: Setup, frequency, and cleaning;Milk Storage Initiated by:: by RN Date initiated:: 02/27/19   Consult Status Consult Status: Follow-up Date: 02/28/19 Follow-up type: In-patient    Danelle Earthly 02/27/2019, 8:59 PM

## 2019-02-27 NOTE — Transfer of Care (Signed)
Immediate Anesthesia Transfer of Care Note  Patient: Sue Cruz  Procedure(s) Performed: CESAREAN SECTION (N/A Abdomen)  Patient Location: PACU  Anesthesia Type:Spinal  Level of Consciousness: awake  Airway & Oxygen Therapy: Patient Spontanous Breathing  Post-op Assessment: Report given to RN and Post -op Vital signs reviewed and stable  Post vital signs: Reviewed and stable  Last Vitals:  Vitals Value Taken Time  BP    Temp    Pulse    Resp    SpO2      Last Pain:  Vitals:   02/27/19 1137  TempSrc: Oral         Complications: No apparent anesthesia complications

## 2019-02-27 NOTE — H&P (Signed)
LABOR AND DELIVERY ADMISSION HISTORY AND PHYSICAL NOTE  Sue Cruz is a 29 y.o. female G30P2002 with IUP at 43w1dby LMP c/w 19wk UKoreapresenting for scheduled CS for breech presentation.   She reports positive fetal movement. She denies leakage of fluid or vaginal bleeding.   She plans on breast and bottle feeding. She requests Nexplanon for birth control.  [facility] P[facility] complications:  - breech presentation  Past Medical History: Past Medical History:  Diagnosis Date  . Medical history non-contributory     Past Surgical History: Past Surgical History:  Procedure Laterality Date  . WISDOM TOOTH EXTRACTION      Obstetrical History: OB History    Gravida  3   Para  2   Term  2   Preterm      AB      Living  2     SAB      TAB      Ectopic      Multiple  0   Live Births  2           Social History: Social History   Socioeconomic History  . Marital status: Married    Spouse name: Not on file  . Number of children: Not on file  . Years of education: Not on file  . Highest education level: Not on file  Occupational History  . Not on file  Tobacco Use  . Smoking status: Never Smoker  . Smokeless tobacco: Never Used  Substance and Sexual Activity  . Alcohol use: No  . Drug use: No  . Sexual activity: Yes    Partners: Male  Other Topics Concern  . Not on file  Social History Narrative  . Not on file   Social Determinants of Health   Financial Resource Strain:   . Difficulty of Paying Living Expenses: Not on file  Food Insecurity:   . Worried About RCharity fundraiserin the Last Year: Not on file  . Ran Out of Food in the Last Year: Not on file  Transportation Needs:   . Lack of Transportation (Medical): Not on file  . Lack of Transportation (Non-Medical): Not on file  Physical Activity:   . Days of Exercise per Week: Not on file  . Minutes of Exercise per Session: Not on file  Stress:    . Feeling of Stress : Not on file  Social Connections:   . Frequency of Communication with Friends and Family: Not on file  . Frequency of Social Gatherings with Friends and Family: Not on file  . Attends Religious Services: Not on file  . Active Member of Clubs or Organizations: Not on file  . Attends CArchivistMeetings: Not on file  . Marital Status: Not on file    Family History: History reviewed. No pertinent family history.  Allergies: No Known Allergies  Medications Prior to Admission  Medication Sig Dispense Refill Last Dose  . acetaminophen (TYLENOL) 500 MG tablet Take 500 mg by mouth every 6 (six) hours as needed.     . Blood Pressure Monitor KIT 1 kit by Does not apply route once a week. Check BP Weekly.  Large Cuff  DX: Z13.6        Z34.86 1 kit 0   . cyclobenzaprine (FLEXERIL) 5 MG tablet Take 1 tablet (5 mg total) by mouth 3 (three) times daily as needed for muscle spasms. 30 tablet 0   . Doxylamine-Pyridoxine (  DICLEGIS) 10-10 MG TBEC Take 2 tablets at bedtime and one in the morning and one in the afternoon as needed for nausea. 60 tablet 2   . Elastic Bandages & Supports (COMFORT FIT MATERNITY SUPP SM) MISC Wear as directed. 1 each 0   . Prenatal Vit-Fe Fumarate-FA (PRENATAL MULTIVITAMIN) TABS tablet Take 1 tablet by mouth daily at 12 noon.        Review of Systems  All systems reviewed and negative except as stated in HPI  Physical Exam Blood pressure 113/61, pulse 72, temperature 98 F (36.7 C), temperature source Oral, resp. rate 16, height 5' (1.524 m), weight 89.8 kg, last menstrual period 05/29/2018, SpO2 99 %. General appearance: alert, oriented, NAD Lungs: normal respiratory effort Heart: regular rate Abdomen: soft, non-tender; gravid Extremities: No calf swelling or tenderness FHR: 151  Prenatal labs: ABO, Rh: --/--/O POS (01/19 1127) Antibody: NEG (01/19 1127) Rubella: 1.67 (07/06 0944) RPR: Non Reactive (10/27 0904)  HBsAg:  Negative (07/06 0944)  HIV: NON REACTIVE (01/19 1130)  GC/Chlamydia: neg/neg 02/06/2019  GBS: --Henderson Cloud (12/29 0142)  2-hr GTT: normal 12/05/2018 Genetic screening:  Low risk panorama Anatomy US: normal  Prenatal Transfer Tool  Maternal Diabetes: No Genetic Screening: Normal Maternal Ultrasounds/Referrals: Normal Fetal Ultrasounds or other Referrals:  None Maternal Substance Abuse:  No Significant Maternal Medications:  None Significant Maternal Lab Results: Group B Strep negative  Results for orders placed or performed during the hospital encounter of 02/27/19 (from the past 24 hour(s))  Type and screen   Collection Time: 02/27/19 11:27 AM  Result Value Ref Range   ABO/RH(D) O POS    Antibody Screen NEG    Sample Expiration      03/02/2019,2359 Performed at [facility], [place] [address] [place], [place] [address]   CBC   Collection Time: 02/27/19 11:30 AM  Result Value Ref Range   WBC 8.3 4.0 - 10.5 K/uL   RBC 5.00 3.87 - 5.11 MIL/uL   Hemoglobin 13.8 12.0 - 15.0 g/dL   HCT 42.2 36.0 - 46.0 %   MCV 84.4 80.0 - 100.0 fL   MCH 27.6 26.0 - 34.0 pg   MCHC 32.7 30.0 - 36.0 g/dL   RDW 14.8 11.5 - 15.5 %   Platelets 286 150 - 400 K/uL   nRBC 0.0 0.0 - 0.2 %  Rapid HIV screen (HIV 1/2 Ab+Ag)   Collection Time: 02/27/19 11:30 AM  Result Value Ref Range   HIV-1 P24 Antigen - HIV24 NON REACTIVE NON REACTIVE   HIV 1/2 Antibodies NON REACTIVE NON REACTIVE   Interpretation (HIV Ag Ab)      A non reactive test result means that HIV 1 or HIV 2 antibodies and HIV 1 p24 antigen were not detected in the specimen.    Patient Active Problem List   Diagnosis Date Noted  . Malpresentation of fetus 02/14/2019  . Supervision of other normal pregnancy, antepartum 08/14/2018  . Nausea and vomiting during pregnancy 08/14/2018    Assessment: Sue Cruz is a 29 y.o. G3P2002 at 75w1dhere for scheduled CS for breech presentation.  #PCS for breech presentation:  The risks of  cesarean section discussed with the patient included but were not limited to: bleeding which may require transfusion or reoperation; infection which may require antibiotics; injury to bowel, bladder, ureters or other surrounding organs; injury to the fetus; need for additional procedures including hysterectomy in the event of a life-threatening hemorrhage; placental abnormalities with subsequent pregnancies, incisional problems, thromboembolic  phenomenon and other postoperative/anesthesia complications. The patient concurred with the proposed plan, giving informed written consent for the procedure. Patient has been NPO since last night she will remain NPO for procedure. Anesthesia and OR aware. Preoperative prophylactic antibiotics and SCDs ordered on call to the OR. To OR when ready.   #Anesthesia: Spinal #FWB: FHR 151 #GBS/ID: negative #COVID: swab negative on 02/24/2019 #MOF: breast and bottle #MOC: Nexplanon #Circ: n/a, girl  Clarnce Flock 02/27/2019, 1:58 PM

## 2019-02-27 NOTE — Op Note (Signed)
Sue Cruz PROCEDURE DATE: 02/27/2019  PREOPERATIVE DIAGNOSIS: Intrauterine pregnancy at  [redacted]w[redacted]d weeks gestation; malpresentation: incomplete breech  POSTOPERATIVE DIAGNOSIS: The same  PROCEDURE: Primary Low Transverse Cesarean Section  SURGEON:  Dr. Loma Cruz  ASSISTANT: Dr Sue Flock MD/MPH  INDICATIONS: Sue Cruz is a 29 y.o. [id number] at [redacted]w[redacted]d scheduled for cesarean section secondary to malpresentation: incomplete breech.  The risks of cesarean section discussed with the patient included but were not limited to: bleeding which may require transfusion or reoperation; infection which may require antibiotics; injury to bowel, bladder, ureters or other surrounding organs; injury to the fetus; need for additional procedures including hysterectomy in the event of a life-threatening hemorrhage; placental abnormalities wth subsequent pregnancies, incisional problems, thromboembolic phenomenon and other postoperative/anesthesia complications. The patient concurred with the proposed plan, giving informed written consent for the procedure.    FINDINGS:  Viable female infant in incomplete breech presentation.  Apgars 8 and 9, weight 3885 grams. Clear amniotic fluid.  Intact placenta, three vessel cord.  Normal uterus, fallopian tubes and ovaries bilaterally.  ANESTHESIA: Spinal INTRAVENOUS FLUIDS:1400 ml ESTIMATED BLOOD LOSS: 460 ml URINE OUTPUT:  100 ml SPECIMENS: Placenta sent to L&D COMPLICATIONS: None immediate  PROCEDURE IN DETAIL:  The patient received intravenous antibiotics and had sequential compression devices applied to her lower extremities while in the preoperative area.  She was then taken to the operating room where spinal anesthesia was administered (epidural anesthesia was dosed up to surgical level) and was found to be adequate. She was then placed in a dorsal supine position with a leftward tilt, and prepped and draped in a sterile manner.  A foley catheter was  placed into her bladder and attached to constant gravity, which drained clear fluid throughout.  After an adequate timeout was performed, a Pfannenstiel skin incision was made with scalpel and carried through to the underlying layer of fascia. The fascia was incised in the midline and this incision was extended bilaterally using the Mayo scissors. Kocher clamps were applied to the superior aspect of the fascial incision and the underlying rectus muscles were dissected off bluntly. A similar process was carried out on the inferior aspect of the facial incision. The rectus muscles were separated in the midline bluntly and the peritoneum was entered bluntly. An Alexis retractor was placed to aid in visualization of the uterus.  Attention was turned to the lower uterine segment where a transverse hysterotomy was made with a scalpel and extended bilaterally bluntly. The infant was successfully delivered from incomplete breech presentation with the R leg in flexion and the L leg in frank breech position, and cord was clamped and cut and infant was handed over to awaiting neonatology team. Uterine massage was then administered and the placenta delivered intact with three-vessel cord. The uterus was then cleared of clot and debris.  The hysterotomy was closed with 0 Vicryl in a running locked fashion, and an imbricating layer was also placed with a 0 Vicryl. There was some separation of the mesosalpinx from the R corner of the mesosalpinx which was oozing and to which Sue Cruz was applied with good effect. Overall, excellent hemostasis was noted. The abdomen and the pelvis were cleared of all clot and debris and the Sue Cruz was removed. Hemostasis was confirmed on all surfaces.  The peritoneum was reapproximated using 2-0 vicryl running stitches. The fascia was then closed using 0 Vicryl in a running fashion. The subcutaneous layer was reapproximated with plain gut and the skin was closed with  4-0 vicryl. The patient tolerated  the procedure well. Sponge, lap, instrument and needle counts were correct x 2. She was taken to the recovery room in stable condition.    Sue Maples, MD 02/27/2019 3:54 PM

## 2019-02-28 ENCOUNTER — Encounter: Payer: Self-pay | Admitting: *Deleted

## 2019-02-28 ENCOUNTER — Encounter: Payer: Medicaid Other | Admitting: Family Medicine

## 2019-02-28 DIAGNOSIS — Z30017 Encounter for initial prescription of implantable subdermal contraceptive: Secondary | ICD-10-CM

## 2019-02-28 LAB — CBC
HCT: 31.8 % — ABNORMAL LOW (ref 36.0–46.0)
Hemoglobin: 10.3 g/dL — ABNORMAL LOW (ref 12.0–15.0)
MCH: 28.1 pg (ref 26.0–34.0)
MCHC: 32.4 g/dL (ref 30.0–36.0)
MCV: 86.6 fL (ref 80.0–100.0)
Platelets: 216 10*3/uL (ref 150–400)
RBC: 3.67 MIL/uL — ABNORMAL LOW (ref 3.87–5.11)
RDW: 14.8 % (ref 11.5–15.5)
WBC: 7.7 10*3/uL (ref 4.0–10.5)
nRBC: 0 % (ref 0.0–0.2)

## 2019-02-28 LAB — RPR: RPR Ser Ql: NONREACTIVE

## 2019-02-28 LAB — TYPE AND SCREEN
ABO/RH(D): O POS
Antibody Screen: NEGATIVE

## 2019-02-28 MED ORDER — LIDOCAINE HCL 1 % IJ SOLN
0.0000 mL | Freq: Once | INTRAMUSCULAR | Status: AC | PRN
  Administered 2019-02-28: 20 mL via INTRADERMAL
  Filled 2019-02-28: qty 20

## 2019-02-28 MED ORDER — ETONOGESTREL 68 MG ~~LOC~~ IMPL
68.0000 mg | DRUG_IMPLANT | Freq: Once | SUBCUTANEOUS | Status: AC
  Administered 2019-02-28: 10:00:00 68 mg via SUBCUTANEOUS
  Filled 2019-02-28: qty 1

## 2019-02-28 NOTE — Progress Notes (Addendum)
Subjective: Postpartum Day 1: Cesarean Delivery Patient reports incisional pain and tolerating PO.  Has not passed flatus or had BM.  Foley was removed at 6 AM, has not voided since foley removal.    Objective: Vital signs in last 24 hours: Temp:  [97.5 F (36.4 C)-98.4 F (36.9 C)] 98.1 F (36.7 C) (01/20 0612) Pulse Rate:  [58-72] 65 (01/20 0612) Resp:  [16-22] 18 (01/20 0208) BP: (89-122)/(55-68) 98/57 (01/20 0612) SpO2:  [98 %-100 %] 98 % (01/20 0612) Weight:  [89.8 kg] 89.8 kg (01/19 1137)  Physical Exam:  General: alert, cooperative and no distress Lochia: appropriate, small rubra Uterine Fundus: firm, U-1 Incision: dressing clean, dry, and intact DVT Evaluation: No evidence of DVT seen on physical exam. Negative Homan's sign. No cords or calf tenderness. No significant calf/ankle edema.  Recent Labs    02/27/19 1130 02/28/19 0515  HGB 13.8 10.3*  HCT 42.2 31.8*    Assessment/Plan: Status post Cesarean section. Doing well postoperatively.  Continue current care.  Felipa Eth, SNM 02/28/2019, 7:42 AM  I confirm that I have verified the information documented in the nurse midwife student's note and that I have also personally reperformed the history, physical exam and all medical decision making activities of this service and have verified that all service and findings are accurately documented in this student's note.  -Patient desires Nexplanon, see Nexplanon insertion note.   Marylene Land, CNM 02/28/2019 9:55 AM

## 2019-02-28 NOTE — Progress Notes (Addendum)
Post-Placental Nexplanon Insertion Procedure Note  Patient was identified. Informed consent was signed, signed copy in chart. A time-out was performed.    The insertion site was identified 8-10 cm (3-4 inches) from the medial epicondyle of the humerus and 3-5 cm (1.25-2 inches) posterior to (below) the sulcus (groove) between the biceps and triceps muscles of the patient's left arm and marked. The site was prepped and draped in the usual sterile fashion. Pt was prepped with alcohol swab and then injected with 3 cc of 1% lidocaine. The site was prepped with betadine. Nexplanon removed form packaging,  Device confirmed in needle, then inserted full length of needle and withdrawn per handbook instructions. Provider and patient verified presence of the implant in the woman's arm by palpation. Pt insertion site was covered with steristrips/adhesive bandage and pressure bandage. There was minimal blood loss. Patient tolerated procedure well.  Patient was given post procedure instructions and Nexplanon user card with expiration date. Condoms were recommended for STI prevention. Patient was asked to keep the pressure dressing on for 24 hours to minimize bruising and keep the adhesive bandage on for 3-5 days. The patient verbalized understanding of the plan of care and agrees.   Lot # [id number] Expiration Date: 04/26/2021    I confirm that I have verified the information documented in the nurse midwife student's note and that I have also personally reperformed the history, physical exam and all medical decision making activities of this service and have verified that all service and findings are accurately documented in this student's note.     Marylene Land, CNM 02/28/2019 9:56 AM

## 2019-02-28 NOTE — Lactation Note (Signed)
This note was copied from a baby's chart. Lactation Consultation Note  Patient Name: Sue Cruz SWNIO'E Date: 02/28/2019 Reason for consult: Follow-up assessment;Difficult latch Baby is 23 hours old.  Mom is formula feeding due to difficult latch.  Nipples are inverted and she is wearing shells.  Discussed benefit of pumping every 3 hours with symphony pump.  Mom agreeable.  She has WIC and would like referral for DEBP after discharge.  Referral faxed to [facility] office.  Symphony pump set up at bedside but mom would like to wait awhile to initiate.  Instructed on use and cleaning.  Encouraged mom to call out for latch assist or other concerns.  Maternal Data    Feeding    LATCH Score                   Interventions    Lactation Tools Discussed/Used WIC Program: Yes Pump Review: Setup, frequency, and cleaning;Milk Storage Initiated by:: [email or web address] Date initiated:: 02/28/19   Consult Status Consult Status: Follow-up Date: 03/01/19 Follow-up type: In-patient    Huston Foley 02/28/2019, 2:08 PM

## 2019-02-28 NOTE — Plan of Care (Signed)
  Problem: Education: Goal: Knowledge of condition will improve Outcome: Progressing   Problem: Activity: Goal: Will verbalize the importance of balancing activity with adequate rest periods Outcome: Progressing   Problem: Activity: Goal: Ability to tolerate increased activity will improve Outcome: Progressing   Problem: Role Relationship: Goal: Ability to demonstrate positive interaction with newborn will improve Outcome: Progressing

## 2019-03-01 MED ORDER — IBUPROFEN 800 MG PO TABS: 800.0000 mg | ORAL_TABLET | Freq: Three times a day (TID) | ORAL | 0 refills | Status: DC

## 2019-03-01 MED ORDER — OXYCODONE HCL 5 MG PO TABS: 5.0000 mg | ORAL_TABLET | ORAL | 0 refills | Status: AC | PRN

## 2019-03-01 MED ORDER — ACETAMINOPHEN 325 MG PO TABS: 650.0000 mg | ORAL_TABLET | Freq: Four times a day (QID) | ORAL | 0 refills | Status: DC | PRN

## 2019-03-01 MED FILL — IBUPROFEN 800 MG TAB: 800 | 10 days supply | Qty: 30 | Fill #0

## 2019-03-01 MED FILL — oxyCODONE HCL 5 MG TABS: 5 | 4 days supply | Qty: 20 | Fill #0

## 2019-03-01 MED FILL — ACETAMINOPHEN 325 MG TABS: 325 | 12 days supply | Qty: 90 | Fill #0

## 2019-03-01 NOTE — Lactation Note (Signed)
This note was copied from a baby's chart. Lactation Consultation Note  Patient Name: Sue Cruz [facility] Date: 03/01/2019 Reason for consult: Follow-up assessment;Difficult latch Baby is 42 hours old/3% weight loss.  Mom is formula feeding due to difficult latch.  She is pumping with symphony pump but not obtaining milk.  Reassured and stressed importance of continuing to pump every 3 hours .  Discussed milk coming to volume and the prevention and treatment of engorgement.  Instructed to call WIC to obtain DEBP.  Reviewed outpatient services and encouraged to call prn.  Maternal Data    Feeding Feeding Type: Bottle Fed - Formula Nipple Type: Slow - flow  LATCH Score                   Interventions    Lactation Tools Discussed/Used     Consult Status Consult Status: Complete Follow-up type: Call as needed    Huston Foley 03/01/2019, 9:35 AM

## 2019-03-01 NOTE — Discharge Instructions (Signed)

## 2019-03-01 NOTE — Progress Notes (Addendum)
Post Partum Day 2 Subjective: up ad lib, voiding, tolerating PO and + flatus. Patient has not had a BM, and describes constant aching abdominal pain with sharp shooting lateral pain when she moves around. She feels the pain is fairly well controlled with medication.   Objective: Blood pressure (!) 108/52, pulse 63, temperature 97.8 F (36.6 C), temperature source Oral, resp. rate 16, height 5' (1.524 m), weight 89.8 kg, last menstrual period 05/29/2018, SpO2 99 %, unknown if currently breastfeeding.  Physical Exam:  General: alert, cooperative and no distress  Cardio: Normal S1, S2, no m/r/g Lochia: appropriate Uterine Fundus: firm Incision: Dressing clean, dry and intact  DVT Evaluation: No evidence of DVT seen on physical exam. No significant calf/ankle edema.  Recent Labs    02/27/19 1130 02/28/19 0515  HGB 13.8 10.3*  HCT 42.2 31.8*    Assessment/Plan: KALISE FICKETT is a [id number] at POD#2 following scheduled 2/2 breech.  Nexplanon was placed, plans to breastfeed, continue lactation consultation. Discharge home today.   LOS: 2 days   Georgiana Spinner 03/01/2019, 7:40 AM   GME ATTESTATION:  I saw and evaluated the patient. I agree with the findings and the plan of care as documented in the student's note. Please see discharge summary for details.  Marlowe Alt, DO OB Fellow, Faculty [facility], [facility] 03/01/2019 9:44 AM

## 2019-03-08 IMAGING — US US PELVIS COMPLETE WITH TRANSVAGINAL
1 series · 13 of 25 positions shown · non-contrast
Comparison: Ultrasound April 20, 2017

CLINICAL DATA: Pelvic pain.

EXAM:
TRANSABDOMINAL AND TRANSVAGINAL ULTRASOUND OF PELVIS
TECHNIQUE: Both transabdominal and transvaginal ultrasound examinations of the
pelvis were performed. Transabdominal technique was performed for
global imaging of the pelvis including uterus, ovaries, adnexal
regions, and pelvic cul-de-sac. It was necessary to proceed with
endovaginal exam following the transabdominal exam to visualize the
endometrium and ovaries.

[Series 1: us pelvis complete with transvaginal · 0.22mm/px · 13 of 60 slices shown]
[im 1/60]
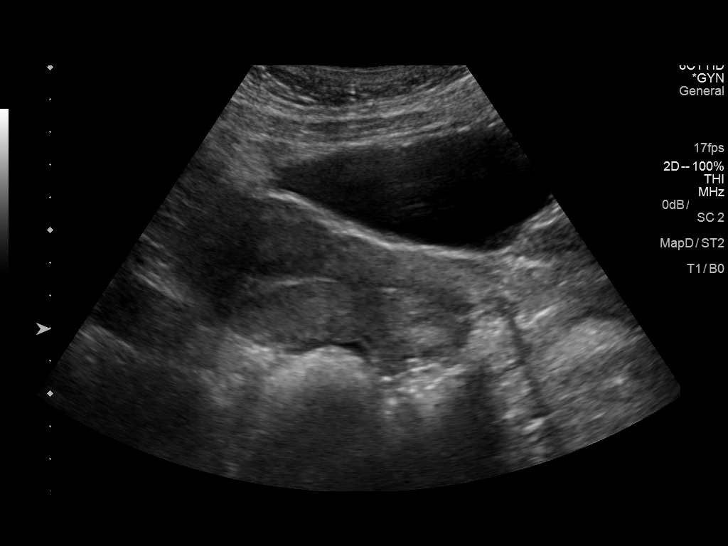
[im 5/60]
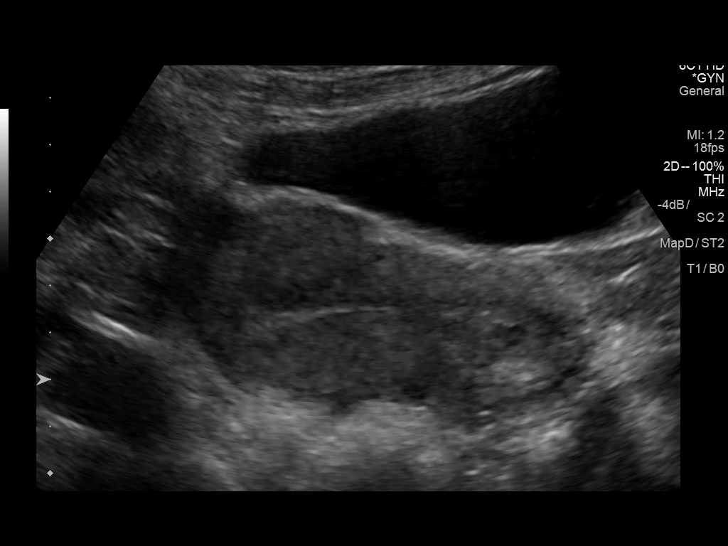
[im 10/60]
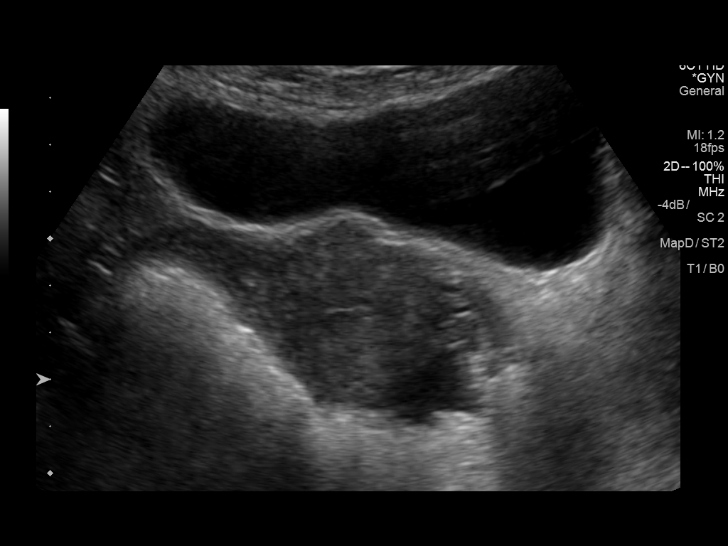
[im 15/60]
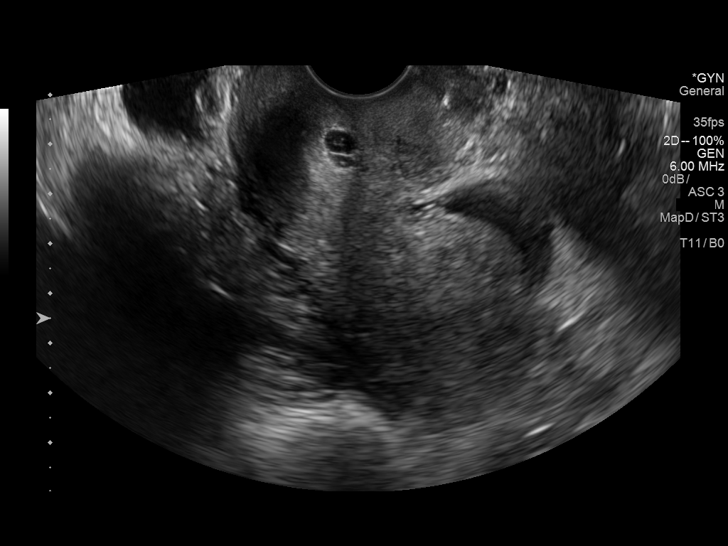
[im 20/60]
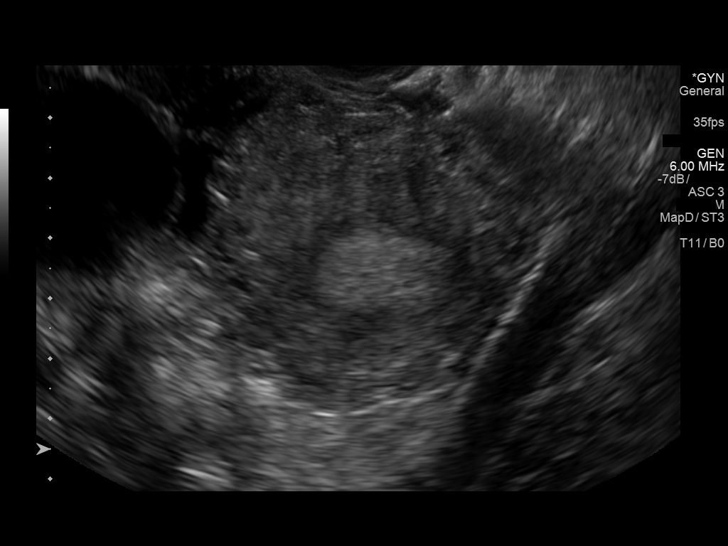
[im 25/60]
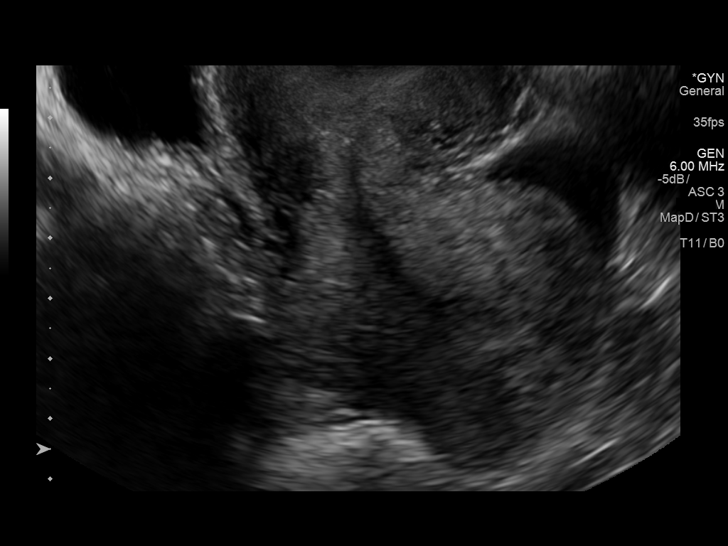
[im 30/60]
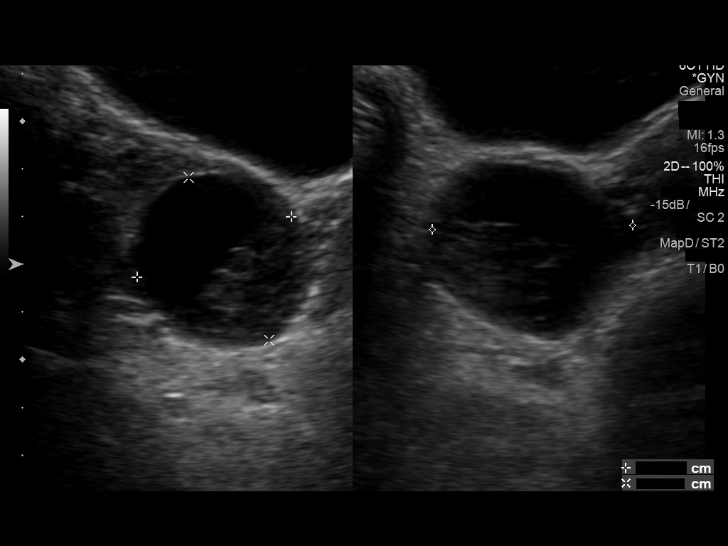
[im 35/60]
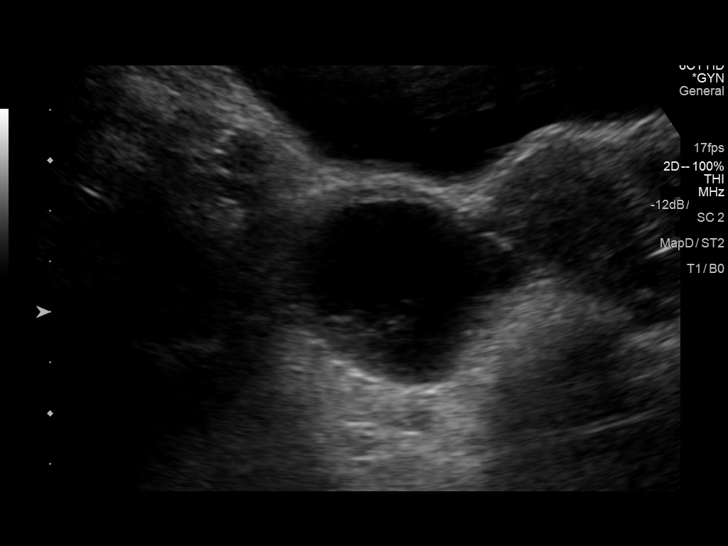
[im 40/60]
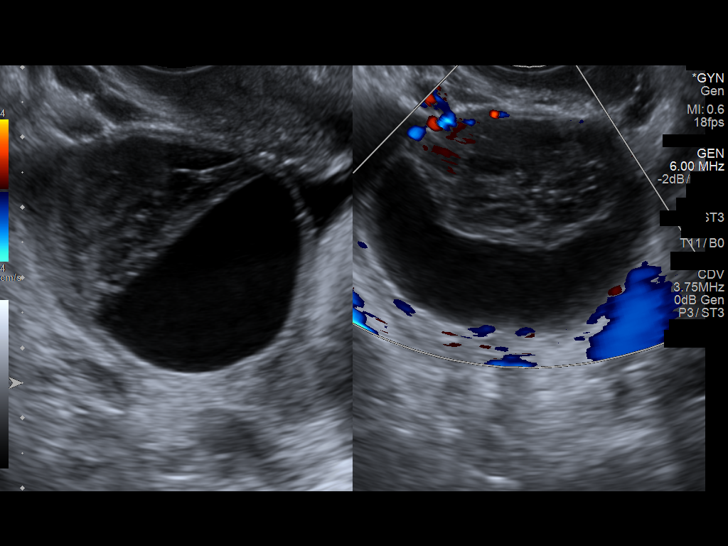
[im 45/60]
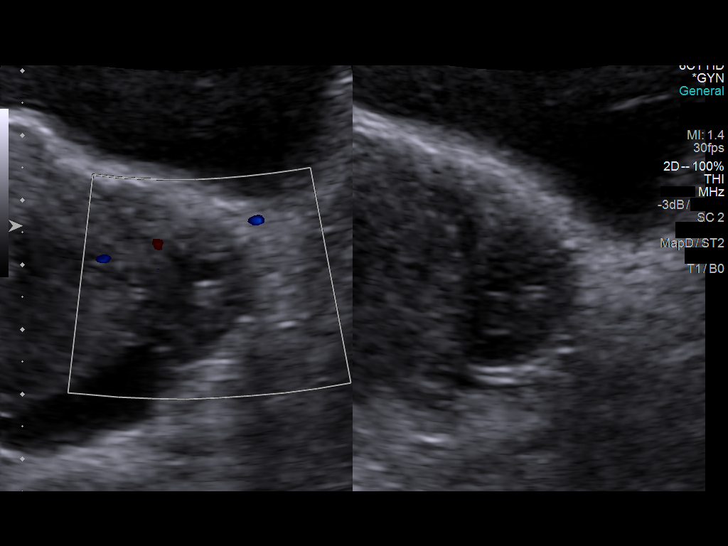
[im 50/60]
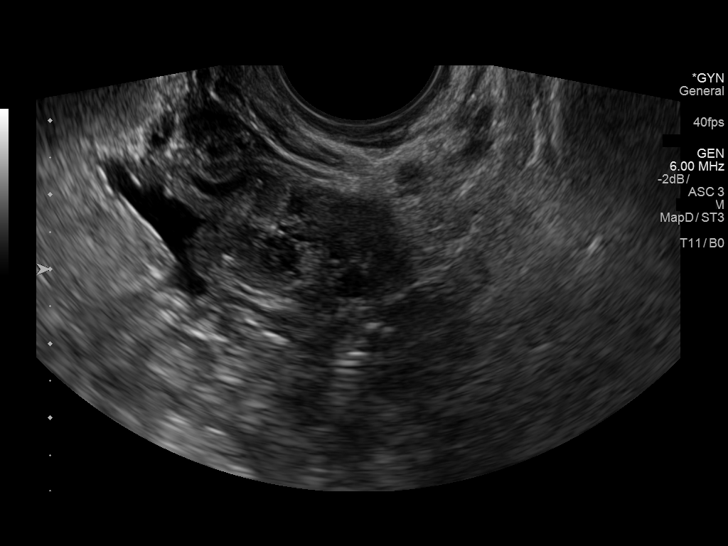
[im 55/60]
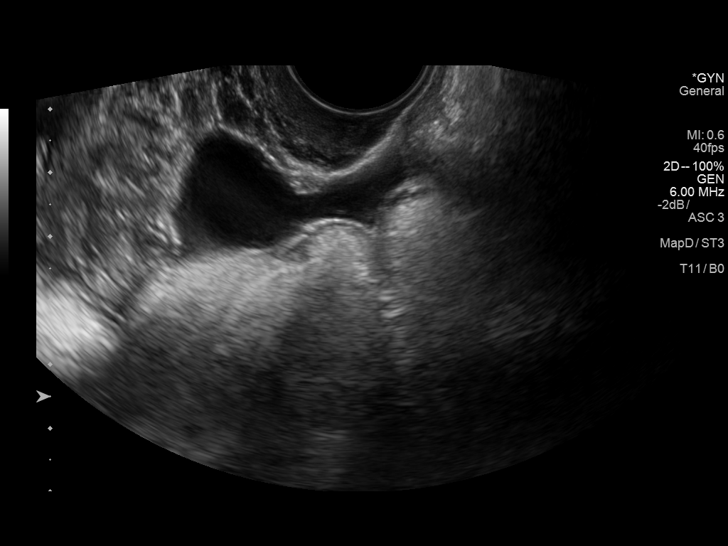
[im 60/60]
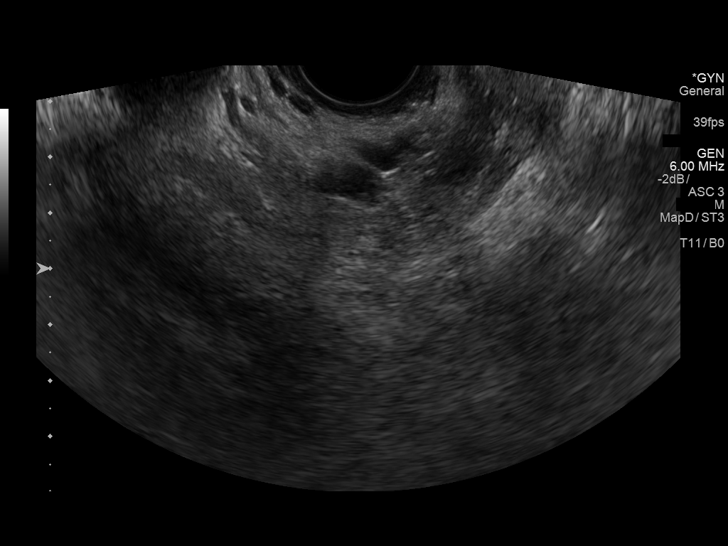

[13 of 25 positions shown; findings below may reference images not displayed]

FINDINGS: Uterus

Measurements: 8.9 x 5.0 x 5.0 cm = volume: 110 mL. No fibroids or
other mass visualized.

Endometrium

Thickness: 9.5 mm.  No focal abnormality visualized.

Right ovary

Measurements: 5.3 x 3.9 x 4.6 cm = volume: 50 mL. Contains a complex
cystic mass measuring 3.6 x 3.3 x 3.9 cm. A portion of the mass is
simple in appearance. However, along the periphery of the mass is a
rounded hypoechoic region without internal blood flow.

Left ovary

Measurements: 1.2 x 2.5 x 1.6 cm = volume: 2.3 mL. Normal
appearance/no adnexal mass.

Other findings

Trace fluid in the pelvis, physiologic.
IMPRESSION: 1. There is a complex cystic mass in the right ovary measuring 3.6 x
3.3 x 3.9 cm. Along the periphery of the mass is a rounded masslike
hypoechoic region without internal blood flow. This mass was not
present in Saturday April, 2017. I suspect this may represent a hemorrhagic
cyst with adherent debris to the side of the cyst. However, a solid
and cystic mass is not excluded on this single study. Recommend a
follow-up ultrasound in 6-12 weeks to ensure resolution.
2. No other abnormalities identified.

These results will be called to the ordering clinician or
representative by the Radiologist Assistant, and communication
documented in the PACS or zVision Dashboard.

## 2019-03-10 ENCOUNTER — Ambulatory Visit (HOSPITAL_COMMUNITY)
Admission: EM | Admit: 2019-03-10 | Discharge: 2019-03-10 | Disposition: A | Discharge status: Home / Self Care | Payer: Medicaid Other | Attending: Family Medicine | Admitting: Family Medicine

## 2019-03-10 ENCOUNTER — Encounter (HOSPITAL_COMMUNITY): Payer: Self-pay

## 2019-03-10 ENCOUNTER — Other Ambulatory Visit: Payer: Self-pay

## 2019-03-10 DIAGNOSIS — N3001 Acute cystitis with hematuria: Secondary | ICD-10-CM | POA: Insufficient documentation

## 2019-03-10 DIAGNOSIS — Z5189 Encounter for other specified aftercare: Secondary | ICD-10-CM | POA: Insufficient documentation

## 2019-03-10 DIAGNOSIS — R1030 Lower abdominal pain, unspecified: Secondary | ICD-10-CM | POA: Diagnosis not present

## 2019-03-10 LAB — POCT URINALYSIS DIP (DEVICE)
Bilirubin Urine: NEGATIVE
Glucose, UA: NEGATIVE mg/dL
Ketones, ur: NEGATIVE mg/dL
Nitrite: NEGATIVE
Protein, ur: NEGATIVE mg/dL
Specific Gravity, Urine: 1.02 (ref 1.005–1.030)
Urobilinogen, UA: 0.2 mg/dL (ref 0.0–1.0)
pH: 6 (ref 5.0–8.0)

## 2019-03-10 MED ORDER — CEPHALEXIN 500 MG PO CAPS: 500.0000 mg | ORAL_CAPSULE | Freq: Two times a day (BID) | ORAL | 0 refills | Status: DC

## 2019-03-10 NOTE — ED Provider Notes (Signed)
[place]   MRN: [id number] DOB: 1990/08/07  Subjective:   Sue Cruz is a 29 y.o. female presenting for 3-day history of urinary frequency, dysuria.  Patient also had a C-section on 02/27/2019.  States that her wound has a discoloration and she is worried about a wound infection.  She has not contacted her OB, states that she does not have a visit with them until 1 week.  Denies fever, nausea, vomiting, wound dehiscence, bleeding from the wound.  The dressing that was placed there is still intact.  Patient is breast-feeding.  No current facility-administered medications for this encounter.  Current Outpatient Medications:  .  acetaminophen (TYLENOL) 325 MG tablet, Take 2 tablets (650 mg total) by mouth every 6 (six) hours as needed for mild pain or moderate pain., Disp: 90 tablet, Rfl: 0 .  Blood Pressure Monitor KIT, 1 kit by Does not apply route once a week. Check BP Weekly.  Large Cuff  DX: Z13.6        Z34.86, Disp: 1 kit, Rfl: 0 .  cyclobenzaprine (FLEXERIL) 5 MG tablet, Take 1 tablet (5 mg total) by mouth 3 (three) times daily as needed for muscle spasms., Disp: 30 tablet, Rfl: 0 .  Doxylamine-Pyridoxine (DICLEGIS) 10-10 MG TBEC, Take 2 tablets at bedtime and one in the morning and one in the afternoon as needed for nausea., Disp: 60 tablet, Rfl: 2 .  Elastic Bandages & Supports (COMFORT FIT MATERNITY SUPP SM) MISC, Wear as directed., Disp: 1 each, Rfl: 0 .  ibuprofen (ADVIL) 800 MG tablet, Take 1 tablet (800 mg total) by mouth every 8 (eight) hours., Disp: 30 tablet, Rfl: 0 .  oxyCODONE (ROXICODONE) 5 MG immediate release tablet, Take 1 tablet (5 mg total) by mouth every 4 (four) hours as needed for moderate pain or severe pain., Disp: 20 tablet, Rfl: 0 .  Prenatal Vit-Fe Fumarate-FA (PRENATAL MULTIVITAMIN) TABS tablet, Take 1 tablet by mouth daily at 12 noon., Disp: , Rfl:    No Known Allergies  Past Medical History:  Diagnosis Date  . Medical history  non-contributory      Past Surgical History:  Procedure Laterality Date  . CESAREAN SECTION N/A 02/27/2019   Procedure: CESAREAN SECTION;  Surgeon: Truett Mainland, DO;  Location: [facility];  Service: Obstetrics;  Laterality: N/A;  . WISDOM TOOTH EXTRACTION      History reviewed. No pertinent family history.  Social History   Tobacco Use  . Smoking status: Never Smoker  . Smokeless tobacco: Never Used  Substance Use Topics  . Alcohol use: No  . Drug use: No    ROS   Objective:   Vitals: BP 122/67 (BP Location: Right Arm)   Pulse 69   Temp 98.1 F (36.7 C) (Oral)   Resp 19   Wt 174 lb (78.9 kg)   LMP 05/29/2018   SpO2 100%   BMI 33.98 kg/m   Physical Exam Constitutional:      General: She is not in acute distress.    Appearance: Normal appearance. She is well-developed and normal weight. She is not ill-appearing, toxic-appearing or diaphoretic.  HENT:     Head: Normocephalic and atraumatic.     Right Ear: External ear normal.     Left Ear: External ear normal.     Nose: Nose normal.     Mouth/Throat:     Mouth: Mucous membranes are moist.     Pharynx: Oropharynx is clear.  Eyes:     General:  No scleral icterus.    Extraocular Movements: Extraocular movements intact.     Pupils: Pupils are equal, round, and reactive to light.  Cardiovascular:     Rate and Rhythm: Normal rate and regular rhythm.     Heart sounds: Normal heart sounds. No murmur. No friction rub. No gallop.   Pulmonary:     Effort: Pulmonary effort is normal. No respiratory distress.     Breath sounds: Normal breath sounds. No stridor. No wheezing, rhonchi or rales.  Abdominal:     General: Bowel sounds are normal. There is no distension.     Palpations: Abdomen is soft. There is no mass.     Tenderness: There is no abdominal tenderness. There is no right CVA tenderness, left CVA tenderness, guarding or rebound.    Skin:    General: Skin is warm and dry.     Coloration: Skin is not  pale.     Findings: No rash.  Neurological:     General: No focal deficit present.     Mental Status: She is alert and oriented to person, place, and time.  Psychiatric:        Mood and Affect: Mood normal.        Behavior: Behavior normal.        Thought Content: Thought content normal.        Judgment: Judgment normal.     Results for orders placed or performed during the hospital encounter of 03/10/19 (from the past 24 hour(s))  POCT urinalysis dip (device)     Status: Abnormal   Collection Time: 03/10/19  1:29 PM  Result Value Ref Range   Glucose, UA NEGATIVE NEGATIVE mg/dL   Bilirubin Urine NEGATIVE NEGATIVE   Ketones, ur NEGATIVE NEGATIVE mg/dL   Specific Gravity, Urine 1.020 1.005 - 1.030   Hgb urine dipstick MODERATE (A) NEGATIVE   pH 6.0 5.0 - 8.0   Protein, ur NEGATIVE NEGATIVE mg/dL   Urobilinogen, UA 0.2 0.0 - 1.0 mg/dL   Nitrite NEGATIVE NEGATIVE   Leukocytes,Ua LARGE (A) NEGATIVE    Assessment and Plan :   1. Acute cystitis with hematuria   2. Lower abdominal pain   3. Visit for wound check     Will start Keflex for cystitis. Urine culture is pending. Will have her recheck with her obstetrician regarding her wound. Counseled patient on potential for adverse effects with medications prescribed/recommended today, ER and return-to-clinic precautions discussed, patient verbalized understanding.    Jaynee Eagles, PA-C 03/10/19 1349

## 2019-03-10 NOTE — Discharge Instructions (Signed)
Please make sure that you call your obstetrician first thing Monday to have them check on her wound.  In the meantime use Keflex to treat your urinary tract infection.  Make sure that you hydrate very well with at least 2 L of water every day.

## 2019-03-10 NOTE — ED Triage Notes (Signed)
Pt states she had a c section  02/27/2019 pt states that around her the incision  her has some brown drainage. Pt states she saw the drainage on this past Thursday. Pt states it hurts when she voids.x 3 days.

## 2019-03-11 LAB — URINE CULTURE

## 2019-03-12 ENCOUNTER — Telehealth: Payer: Self-pay

## 2019-03-12 NOTE — Telephone Encounter (Signed)
Spoke with pt regarding concerns about her C section incision. Pt reports that she removed the honeycomb dressing as instructed, but she reports that the tape under that is coming up on the bottom and theres some light drainage coming out. The pt denies fever or redness/warmth around incision site. I advised pt that I will have schedulers call her to move her incision check appt up to this week instead of next, but should she develop any new symptoms as mentioned above in the mean time she should go to the hospital for evaluation.Pt voices understanding.

## 2019-03-15 ENCOUNTER — Ambulatory Visit (INDEPENDENT_AMBULATORY_CARE_PROVIDER_SITE_OTHER): Payer: Self-pay

## 2019-03-15 ENCOUNTER — Other Ambulatory Visit: Payer: Self-pay

## 2019-03-15 DIAGNOSIS — Z9889 Other specified postprocedural states: Secondary | ICD-10-CM

## 2019-03-15 DIAGNOSIS — Z4889 Encounter for other specified surgical aftercare: Secondary | ICD-10-CM

## 2019-03-15 NOTE — Progress Notes (Signed)
The wound is C/D/I. The patient is alerted to watch for any signs of infection (redness, pus, pain, increased swelling or fever) and call if such occurs. Home wound care instructions are provided. Tetanus vaccination status reviewed: 01/09/2019.

## 2019-03-15 NOTE — Progress Notes (Signed)
Patient ID: Sue Cruz, female   DOB: 07-28-1990, 29 y.o.   MRN: [id number] Patient seen and assessed by nursing staff during this encounter. I have reviewed the chart and agree with the documentation and plan.  Scheryl Darter, MD 03/15/2019 5:03 PM

## 2019-03-19 ENCOUNTER — Ambulatory Visit: Payer: Medicaid Other

## 2019-03-28 ENCOUNTER — Ambulatory Visit (INDEPENDENT_AMBULATORY_CARE_PROVIDER_SITE_OTHER): Payer: Medicaid Other | Admitting: Obstetrics & Gynecology

## 2019-03-28 ENCOUNTER — Other Ambulatory Visit: Payer: Self-pay

## 2019-03-28 NOTE — Progress Notes (Signed)
Subjective:     Sue Cruz is a 29 y.o. female who presents for a postpartum visit. She is 4 weeks postpartum following a low cervical transverse Cesarean section. I have fully reviewed the prenatal and intrapartum course. The delivery was at 39.1 gestational weeks. Outcome: primary cesarean section, low transverse incision. Anesthesia: epidural. Postpartum course has been Unremarkable. Baby's course has been Unremarkable. Baby is feeding by Pumping. Bleeding no bleeding. Bowel function is Constipated. Bladder function is normal. Patient is not sexually active. Contraception method is Nexplanon. Postpartum depression screening: negative=0.  The following portions of the patient's history were reviewed and updated as appropriate: allergies, current medications, past family history, past medical history, past social history, past surgical history and problem list.  Review of Systems Pertinent items are noted in HPI.   Objective:    BP 104/66   Pulse 62   Ht 5' (1.524 m)   Wt 166 lb (75.3 kg)   LMP 05/29/2018   Breastfeeding Yes   BMI 32.42 kg/m   General:  alert, cooperative and no distress           Abdomen: soft, non-tender; bowel sounds normal; no masses,  no organomegaly and incision well approximated, tiny stitch abscess right edge with small amount of purulent drainage   Vulva:  not evaluated  Vagina: not evaluated   Assessment:     4 week postpartum exam. Stitch abscess should resolve spontaneously.  Pap smear not done at today's visit.   Plan:    1. Contraception: Nexplanon 2. Nexplanon in place.Breastfeeding encouraged 3. Follow up in 12 + months routine or as needed.    Adam Phenix, MD 03/28/2019

## 2019-03-28 NOTE — Patient Instructions (Signed)
Constipation, Adult Constipation is when a person has fewer bowel movements in a week than normal, has difficulty having a bowel movement, or has stools that are dry, hard, or larger than normal. Constipation may be caused by an underlying condition. It may become worse with age if a person takes certain medicines and does not take in enough fluids. Follow these instructions at home: Eating and drinking   Eat foods that have a lot of fiber, such as fresh fruits and vegetables, whole grains, and beans.  Limit foods that are high in fat, low in fiber, or overly processed, such as french fries, hamburgers, cookies, candies, and soda.  Drink enough fluid to keep your urine clear or pale yellow. General instructions  Exercise regularly or as told by your health care provider.  Go to the restroom when you have the urge to go. Do not hold it in.  Take over-the-counter and prescription medicines only as told by your health care provider. These include any fiber supplements. For example Metamucil or similar medication.  Practice pelvic floor retraining exercises, such as deep breathing while relaxing the lower abdomen and pelvic floor relaxation during bowel movements.  Watch your condition for any changes.  Keep all follow-up visits as told by your health care provider. This is important. Contact a health care provider if:  You have pain that gets worse.  You have a fever.  You do not have a bowel movement after 4 days.  You vomit.  You are not hungry.  You lose weight.  You are bleeding from the anus.  You have thin, pencil-like stools. Get help right away if:  You have a fever and your symptoms suddenly get worse.  You leak stool or have blood in your stool.  Your abdomen is bloated.  You have severe pain in your abdomen.  You feel dizzy or you faint. This information is not intended to replace advice given to you by your health care provider. Make sure you discuss any  questions you have with your health care provider. Document Revised: 01/07/2017 Document Reviewed: 07/16/2015 Elsevier Patient Education  2020 [employer].

## 2020-03-07 IMAGING — US TRANSVAGINAL OB ULTRASOUND
1 series · 15 of 28 positions shown · non-contrast
Comparison: Pelvic ultrasound 03/27/2018

CLINICAL DATA: Pelvic pain, positive urine pregnancy test

EXAM:
TRANSVAGINAL OB ULTRASOUND
TECHNIQUE: Transvaginal ultrasound was performed for complete evaluation of the
gestation as well as the maternal uterus, adnexal regions, and
pelvic cul-de-sac.

[Series 1: transvaginal ob ultrasound · 40 acquisitions, 15 frames shown]
[im 1/40]
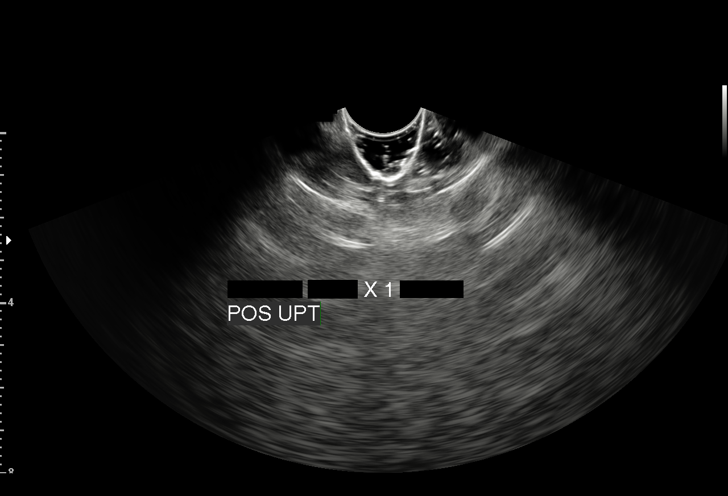
[im 3/40]
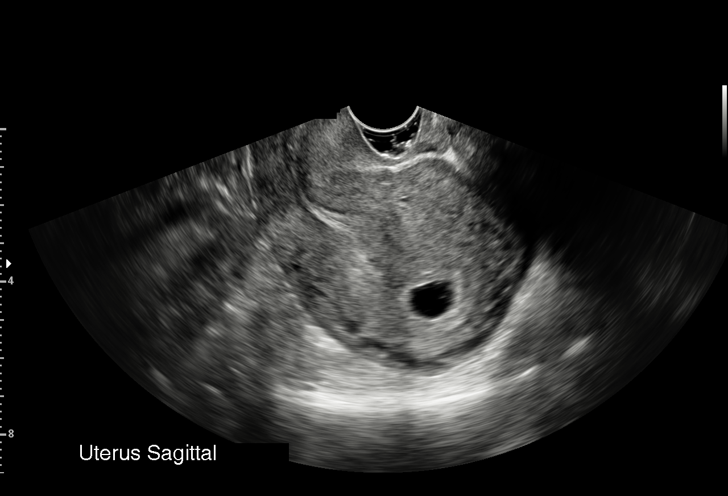
[im 6/40]
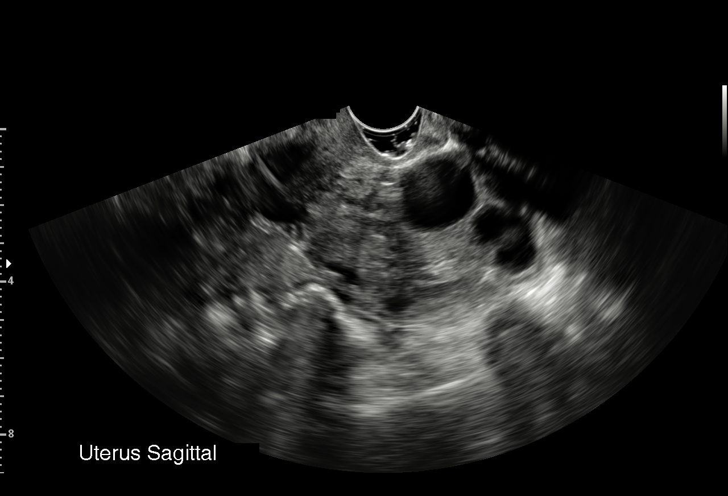
[im 9/40]
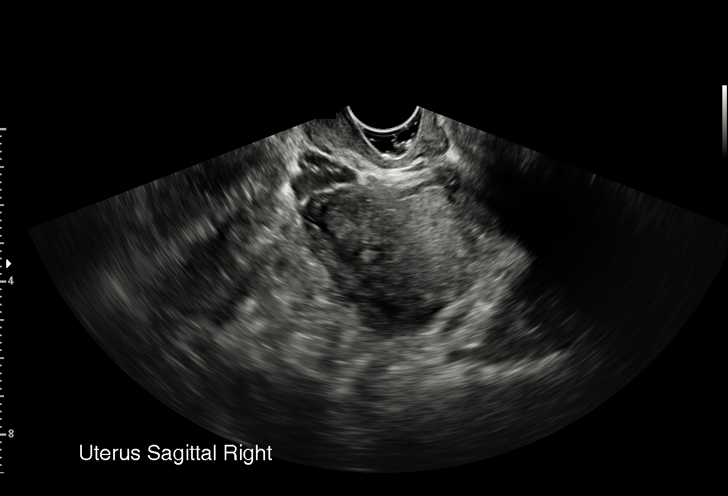
[im 12/40]
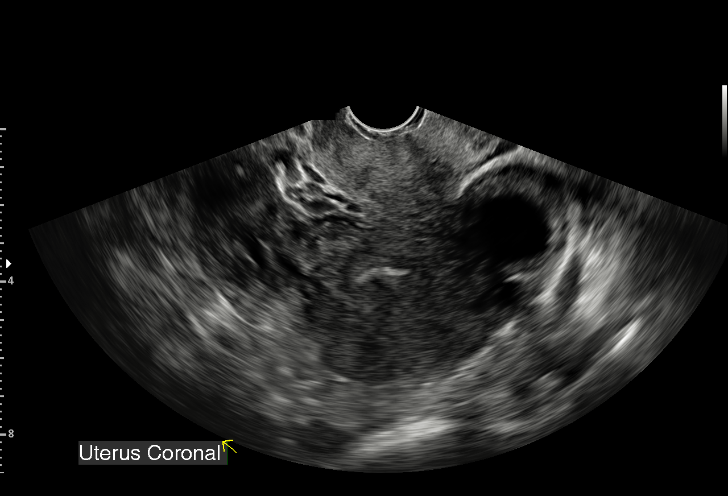
[im 15/40]
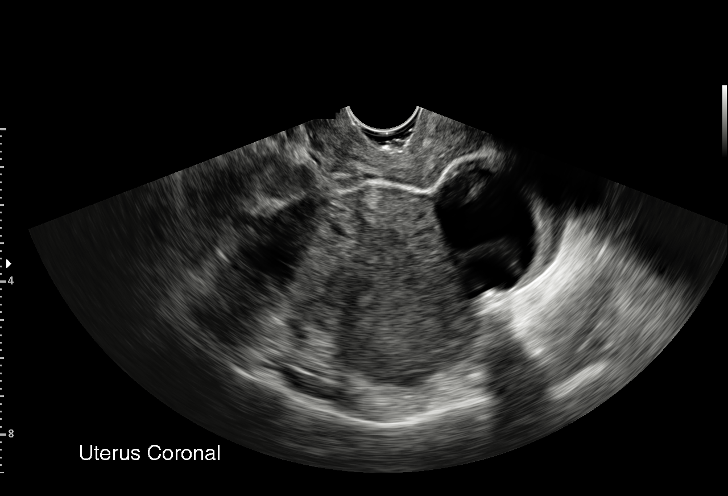
[im 18/40]
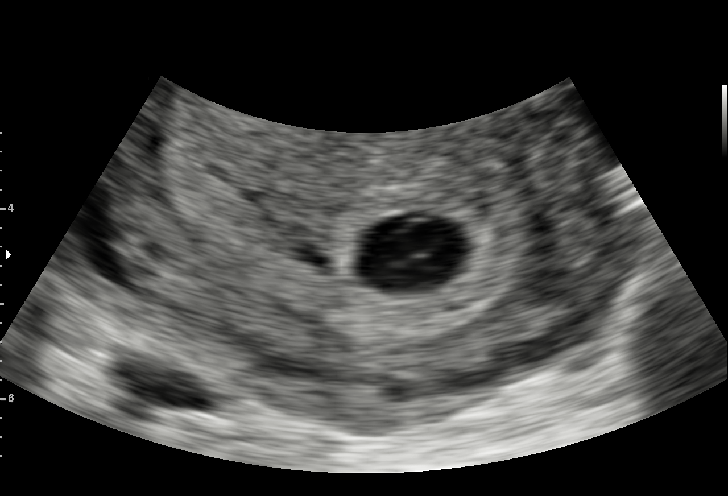
[im 21/40]
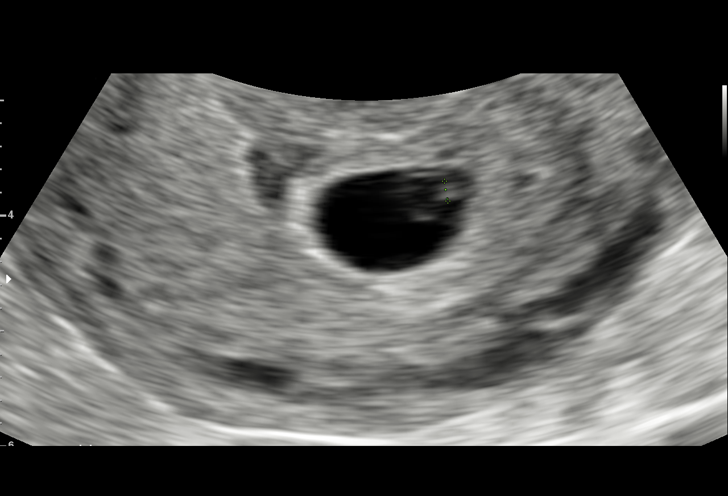
[im 22/40]
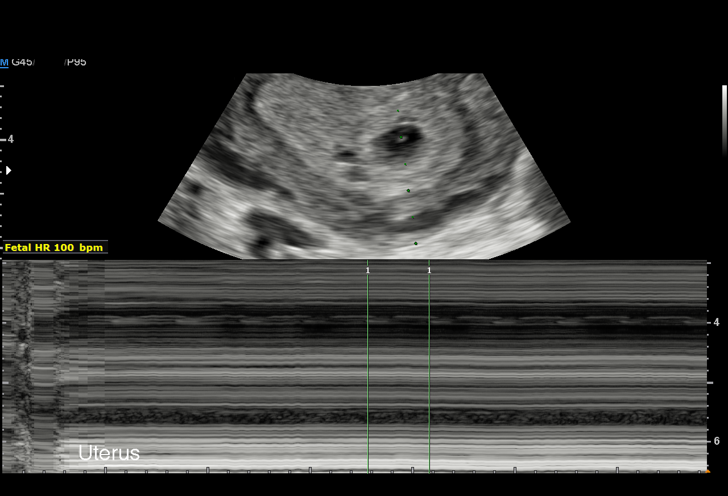
[im 25/40]
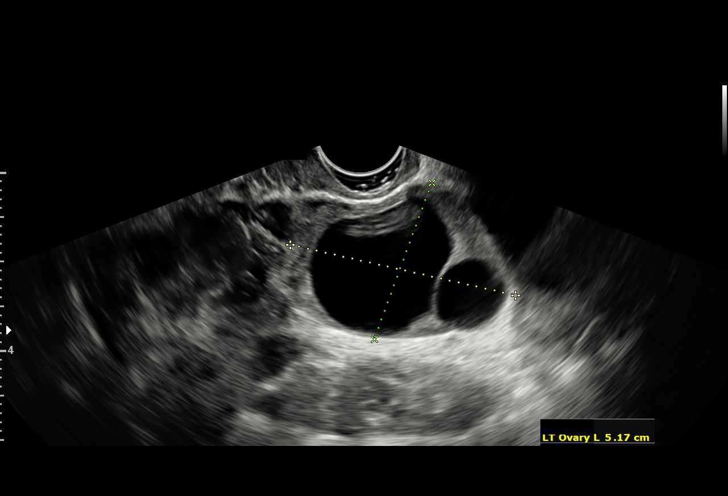
[im 28/40]
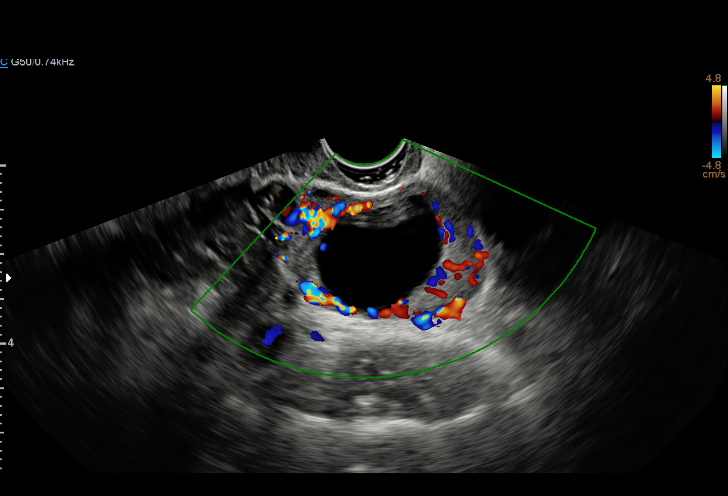
[im 31/40]
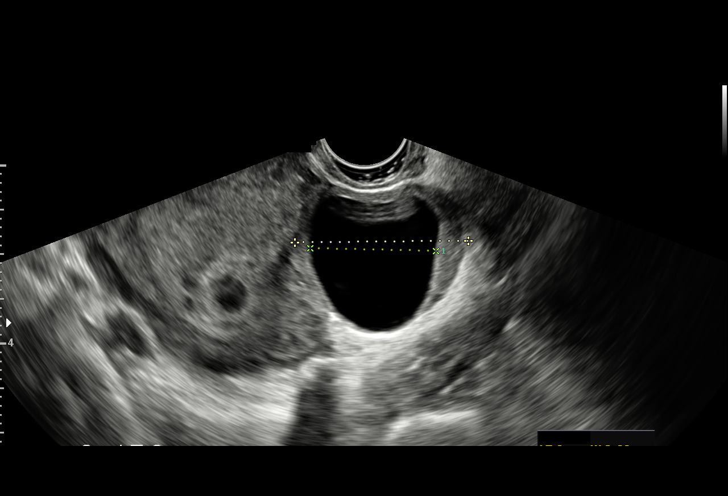
[im 34/40]
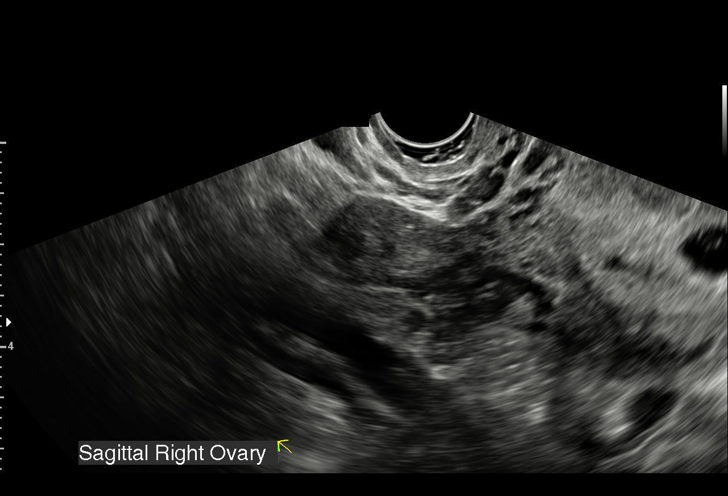
[im 37/40]
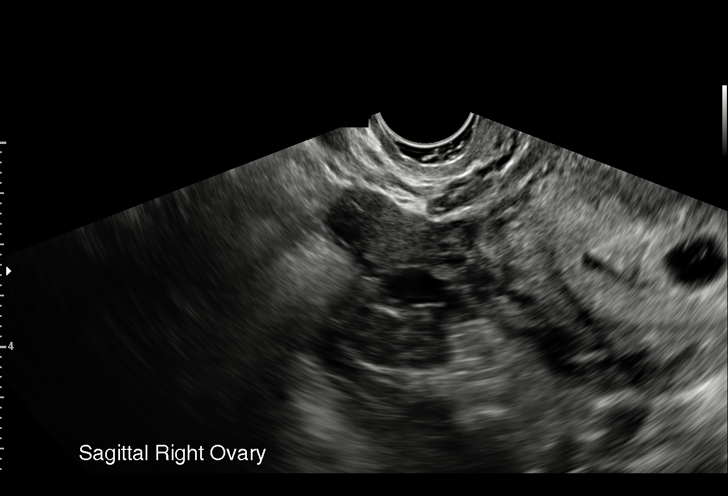
[im 40/40]
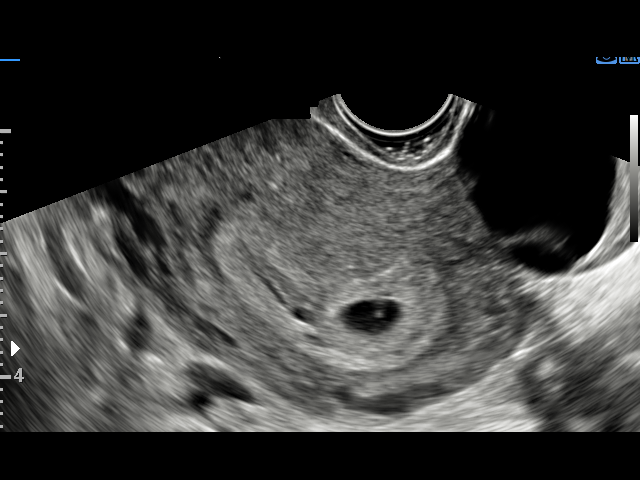

[15 of 28 positions shown; findings below may reference images not displayed]

FINDINGS: Intrauterine gestational sac: Single

Yolk sac:  Visualized.

Embryo:  Visualized.

Cardiac Activity: Visualized.

Heart Rate: 100 bpm

CRL: 1.8 mm, gestational age and EDC not given due to very small
size of the fetal pole (out of range)

Subchorionic hemorrhage:  Small subchorionic hemorrhage

Maternal uterus/adnexae: Right ovary is within normal limits and
measures 2.1 x 2.9 x 1.7 cm. Left ovary measures 3 point 9 x 5.2 x
3.7 cm and contains 2 anechoic cysts, measuring up to 3.3 cm in
size. Trace free fluid.
IMPRESSION: Single viable intrauterine pregnancy as above. Small subchorionic
hemorrhage and trace free fluid in the pelvis

## 2020-06-09 IMAGING — US US MFM OB COMP +14 WKS
1 series · 13 of 28 positions shown · non-contrast
Comparison: none

[Series 1: us mfm ob comp +14 wks · 126 acquisitions, 13 frames shown]
[im 5/126]
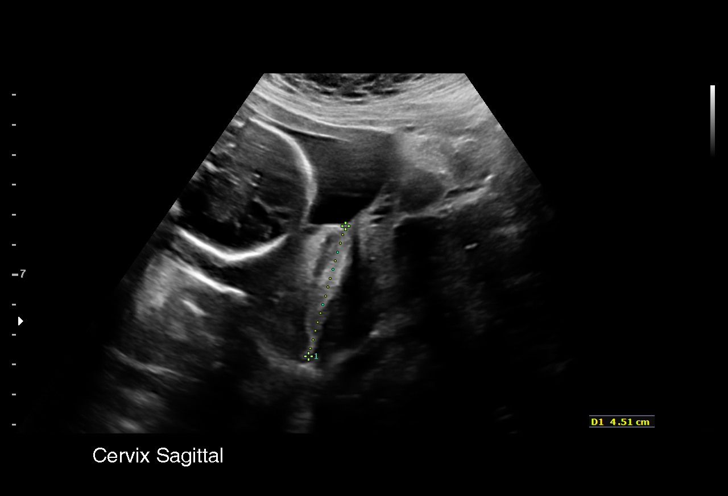
[im 14/126]
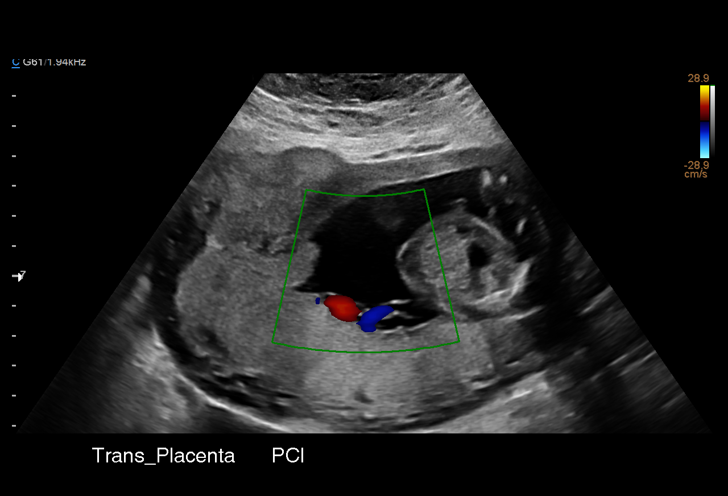
[im 24/126]
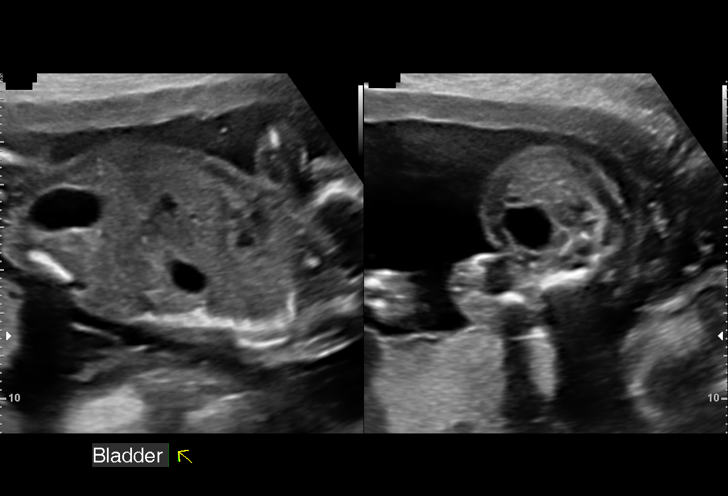
[im 33/126]
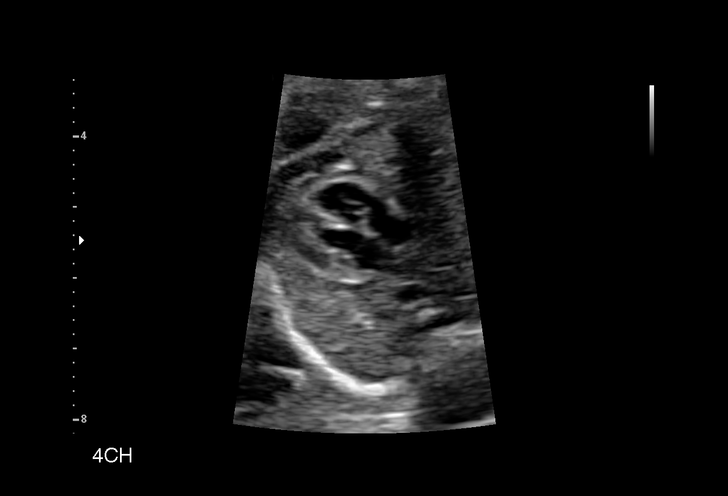
[im 42/126]
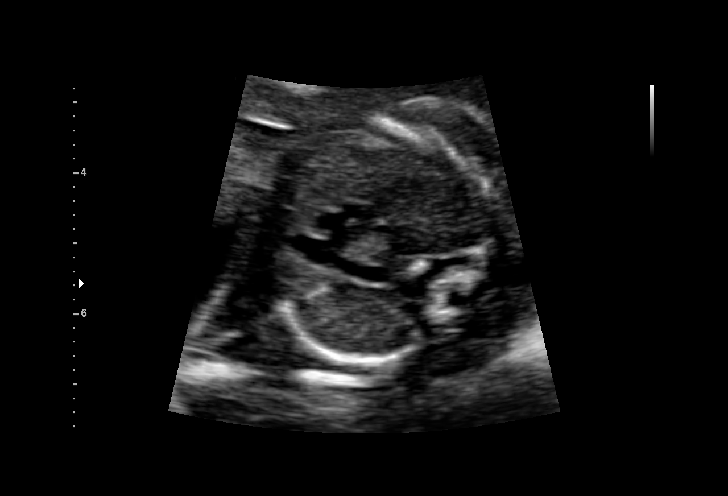
[im 51/126]
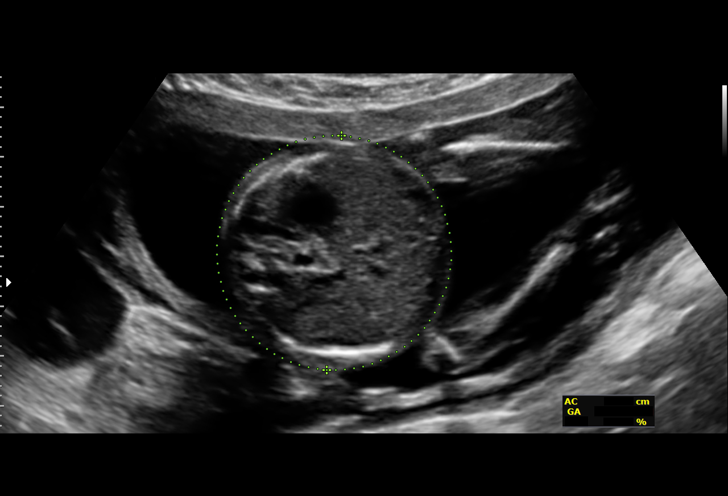
[im 65/126]
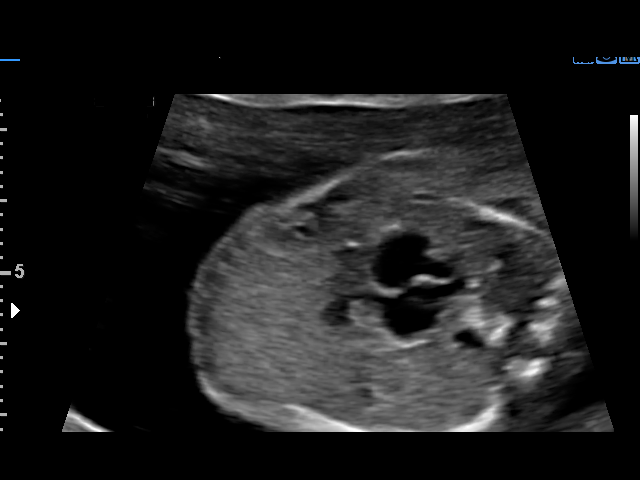
[im 75/126]
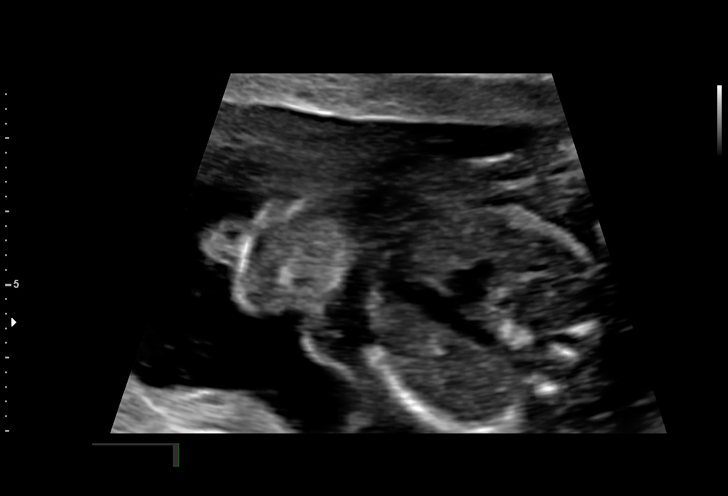
[im 84/126]
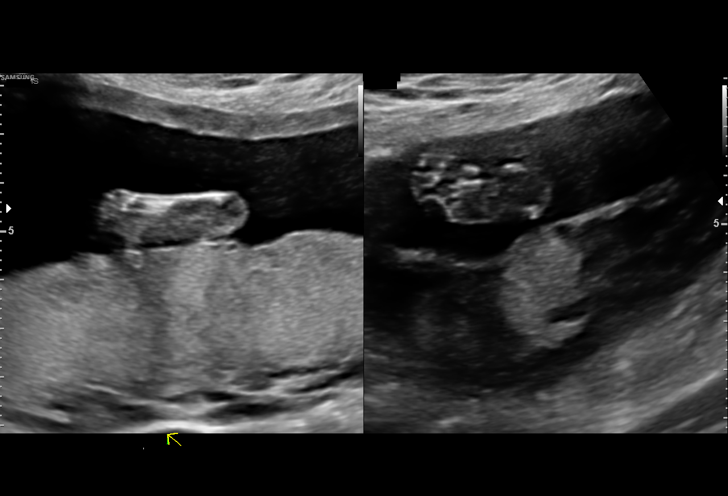
[im 93/126]
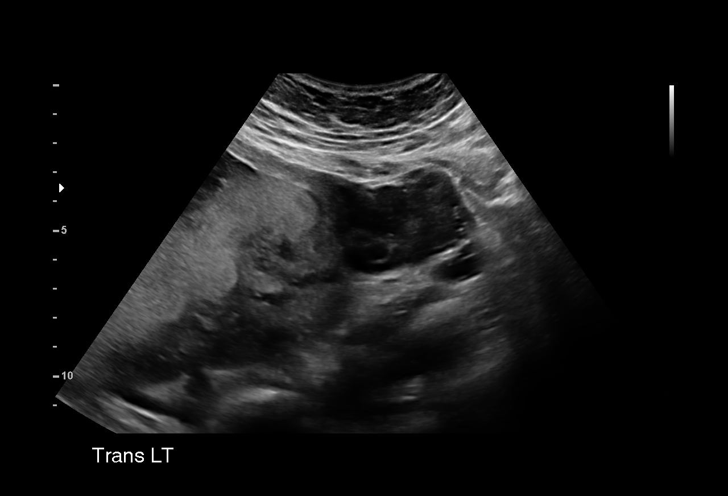
[im 102/126]
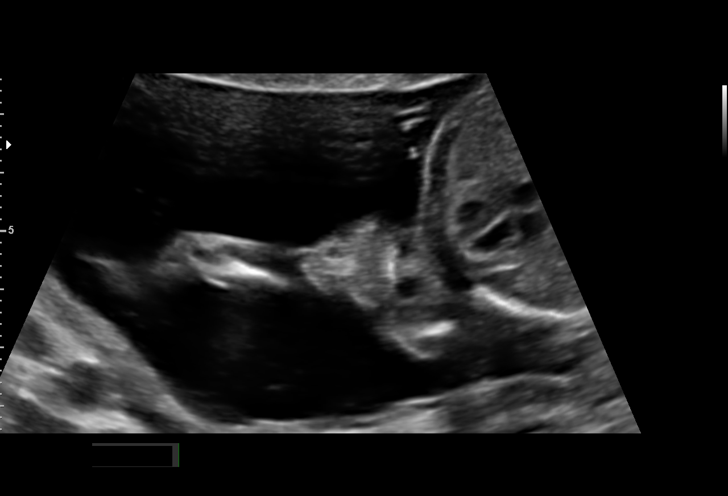
[im 112/126]
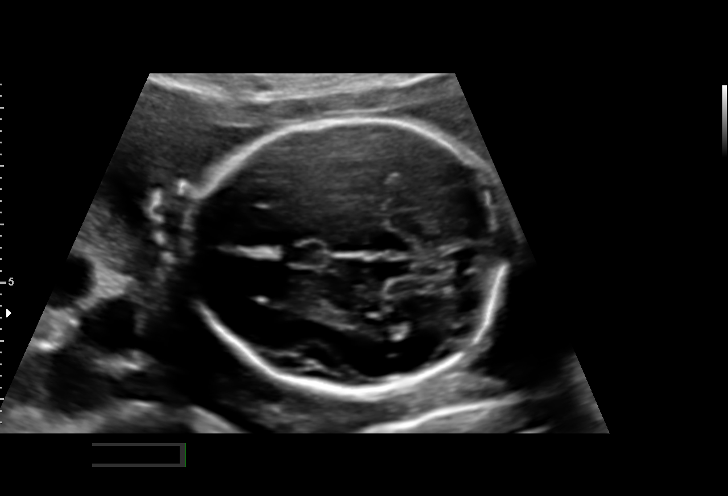
[im 121/126]
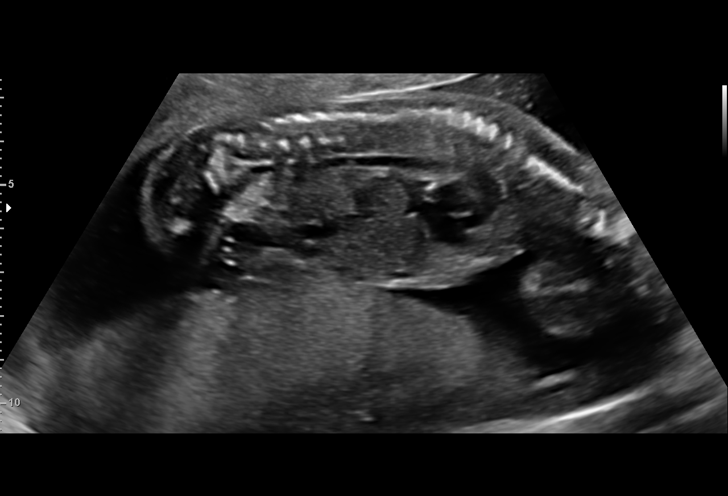

[13 of 28 positions shown; findings below may reference images not displayed]

Performed By:     [facility]        Secondary Phy.:    [REDACTED]
                   [facility] CNM                              [HOSPITAL]
 Ref. Address:     [REDACTED]
                   OB/GYN
                   [id number]

  1  US MFM OB COMP + 14 WK               [address]     [place]
 ----------------------------------------------------------------------

 ----------------------------------------------------------------------
Indications

  Encounter for antenatal screening for
  malformations (Low risk NIPs, negative
  carrier screening)
  19 weeks gestation of pregnancy
 ----------------------------------------------------------------------
Fetal Evaluation

 Num Of Fetuses:          1
 Fetal Heart Rate(bpm):   144
 Cardiac Activity:        Observed
 Presentation:            Cephalic
 Placenta:                Posterior
 P. Cord Insertion:       Visualized, central

 Amniotic Fluid
 AFI FV:      Within normal limits

                             Largest Pocket(cm)

Biometry

 BPD:      47.2  mm     G. Age:  20w 2d         92  %    CI:        76.32   %    70 - 86
                                                         FL/HC:       17.6  %    16.1 -
 HC:      171.2  mm     G. Age:  19w 5d         76  %    HC/AC:       1.14       1.09 -
 AC:      150.6  mm     G. Age:  20w 2d         85  %    FL/BPD:      63.8  %
 FL:       30.1  mm     G. Age:  19w 2d         55  %    FL/AC:       20.0  %    20 - 24
 CER:        20  mm     G. Age:  19w 0d         50  %
 NFT:         4  mm

 LV:        5.3  mm
 CM:        4.2  mm

 Est. FW:     316   gm   0 lb 11 oz      89  %
OB History

 Gravidity:    3         Term:   2
 Living:       2
Gestational Age

 LMP:           19w 0d        Date:  05/29/18                 EDD:   03/05/19
 U/S Today:     19w 6d                                        EDD:   02/27/19
 Best:          19w 0d     Det. By:  LMP  (05/29/18)          EDD:   03/05/19
Anatomy

 Cranium:               Appears normal         LVOT:                   Appears normal
 Cavum:                 Appears normal         Aortic Arch:            Appears normal
 Ventricles:            Appears normal         Ductal Arch:            Appears normal
 Choroid Plexus:        Appears normal         Diaphragm:              Appears normal
 Cerebellum:            Appears normal         Stomach:                Appears normal, left
                                                                       sided
 Posterior Fossa:       Appears normal         Abdomen:                Appears normal
 Nuchal Fold:           Appears normal         Abdominal Wall:         Appears nml (cord
                                                                       insert, abd wall)
 Face:                  Appears normal         Cord Vessels:           Appears normal (3
                        (orbits and profile)                           vessel cord)
 Lips:                  Appears normal         Kidneys:                Appear normal
 Palate:                Not well visualized    Bladder:                Appears normal
 Thoracic:              Appears normal         Spine:                  Appears normal
 Heart:                 Appears normal         Upper Extremities:      Appears normal
                        (4CH, axis, and
                        situs)
 RVOT:                  Appears normal         Lower Extremities:      Appears normal

 Other:  Female gender Nasal bone visualized. Heels and 5th digit visualized.
         Lenses visualized.
Cervix Uterus Adnexa

 Cervix
 Length:            4.5  cm.
 Normal appearance by transabdominal scan.

 Uterus
 No abnormality visualized.

 Left Ovary
 Within normal limits.

 Right Ovary
 Within normal limits.

 Cul De Sac
 No free fluid seen.
 Adnexa
 No abnormality visualized.
Impression

 Normal interval growth.  No ultrasonic evidence of structural
 fetal anomalies.
 NIPS low risk
 Good fetal movement and amniotic fluid.
Recommendations

 Follow up as clinically indicated

## 2020-06-25 ENCOUNTER — Ambulatory Visit (HOSPITAL_COMMUNITY)
Admission: EM | Admit: 2020-06-25 | Discharge: 2020-06-25 | Disposition: A | Discharge status: Home / Self Care | Payer: Medicaid Other | Attending: Family Medicine | Admitting: Family Medicine

## 2020-06-25 ENCOUNTER — Other Ambulatory Visit: Payer: Self-pay

## 2020-06-25 ENCOUNTER — Encounter (HOSPITAL_COMMUNITY): Payer: Self-pay | Admitting: Emergency Medicine

## 2020-06-25 DIAGNOSIS — L509 Urticaria, unspecified: Secondary | ICD-10-CM

## 2020-06-25 MED ORDER — PREDNISONE 20 MG PO TABS: 40.0000 mg | ORAL_TABLET | Freq: Every day | ORAL | 0 refills | Status: DC

## 2020-06-25 NOTE — ED Provider Notes (Signed)
  [facility]   [id number] 06/25/20 Arrival Time: 1803  ASSESSMENT & PLAN:  1. Urticaria    Begin: Meds ordered this encounter  Medications  . predniSONE (DELTASONE) 20 MG tablet    Sig: Take 2 tablets (40 mg total) by mouth daily.    Dispense:  10 tablet    Refill:  0   Benadryl if needed.  Will follow up with PCP or here if worsening or failing to improve as anticipated. Reviewed expectations re: course of current medical issues. Questions answered. Outlined signs and symptoms indicating need for more acute intervention. Patient verbalized understanding. After Visit Summary given.   SUBJECTIVE:  Sue Cruz is a 30 y.o. female who presents with a skin complaint. Itchy rash; several days. Unsure of trigger. Similar in the past around this time of year. No tx PTA. Afebrile. No respiratory or swallowing difficulties.    OBJECTIVE: Vitals:   06/25/20 1825  BP: 122/70  Pulse: 91  Resp: 18  Temp: 98.3 F (36.8 C)  TempSrc: Oral  SpO2: 99%    General appearance: alert; no distress HEENT: Sue Cruz; AT Neck: supple with FROM Lungs: unlabored Extremities: no edema; moves all extremities normally Skin: warm and dry; no signs of infection; smooth, slightly elevated and erythematous plaques of variable size over her trunk and upper extremities Psychological: alert and cooperative; normal mood and affect  No Known Allergies  Past Medical History:  Diagnosis Date  . Medical history non-contributory    Social History   Socioeconomic History  . Marital status: Married    Spouse name: Not on file  . Number of children: Not on file  . Years of education: Not on file  . Highest education level: Not on file  Occupational History  . Not on file  Tobacco Use  . Smoking status: Never Smoker  . Smokeless tobacco: Never Used  Vaping Use  . Vaping Use: Never used  Substance and Sexual Activity  . Alcohol use: No  . Drug use: No  . Sexual activity: Yes     Partners: Male  Other Topics Concern  . Not on file  Social History Narrative  . Not on file   Social Determinants of Health   Financial Resource Strain: Not on file  Food Insecurity: Not on file  Transportation Needs: Not on file  Physical Activity: Not on file  Stress: Not on file  Social Connections: Not on file  Intimate Partner Violence: Not on file   History reviewed. No pertinent family history. Past Surgical History:  Procedure Laterality Date  . CESAREAN SECTION N/A 02/27/2019   Procedure: CESAREAN SECTION;  Surgeon: Levie Heritage, DO;  Location: [facility];  Service: Obstetrics;  Laterality: N/A;  . WISDOM TOOTH EXTRACTION       Mardella Layman, MD 06/25/20 1842

## 2020-06-25 NOTE — Discharge Instructions (Addendum)
You may take Benadryl with the prescribed medication.

## 2020-06-25 NOTE — ED Triage Notes (Signed)
Pt presents today with c/o of rash to forearms, chest and legs since yesterday. She reports same rash appears every year at this time. No meds pta.

## 2020-08-22 DIAGNOSIS — R102 Pelvic and perineal pain: Secondary | ICD-10-CM | POA: Diagnosis not present

## 2020-08-22 DIAGNOSIS — Z0000 Encounter for general adult medical examination without abnormal findings: Secondary | ICD-10-CM | POA: Diagnosis not present

## 2020-08-22 DIAGNOSIS — Z136 Encounter for screening for cardiovascular disorders: Secondary | ICD-10-CM | POA: Diagnosis not present

## 2020-08-22 DIAGNOSIS — Z7689 Persons encountering health services in other specified circumstances: Secondary | ICD-10-CM | POA: Diagnosis not present

## 2020-08-22 DIAGNOSIS — N941 Unspecified dyspareunia: Secondary | ICD-10-CM | POA: Diagnosis not present

## 2020-08-22 DIAGNOSIS — N898 Other specified noninflammatory disorders of vagina: Secondary | ICD-10-CM | POA: Diagnosis not present

## 2020-08-22 DIAGNOSIS — Z0001 Encounter for general adult medical examination with abnormal findings: Secondary | ICD-10-CM | POA: Diagnosis not present

## 2021-11-19 ENCOUNTER — Ambulatory Visit: Payer: Medicaid Other | Admitting: Obstetrics and Gynecology

## 2022-03-12 ENCOUNTER — Ambulatory Visit (INDEPENDENT_AMBULATORY_CARE_PROVIDER_SITE_OTHER): Payer: Medicaid Other | Admitting: Certified Nurse Midwife

## 2022-03-12 ENCOUNTER — Encounter: Payer: Self-pay | Admitting: Certified Nurse Midwife

## 2022-03-12 ENCOUNTER — Other Ambulatory Visit (HOSPITAL_COMMUNITY)
Admission: RE | Admit: 2022-03-12 | Discharge: 2022-03-12 | Disposition: A | Discharge status: Home / Self Care | Payer: Medicaid Other | Source: Ambulatory Visit | Attending: Certified Nurse Midwife | Admitting: Certified Nurse Midwife

## 2022-03-12 VITALS — BP 104/75 | HR 72 | Ht 60.0 in | Wt 167.2 lb

## 2022-03-12 DIAGNOSIS — Z01419 Encounter for gynecological examination (general) (routine) without abnormal findings: Secondary | ICD-10-CM

## 2022-03-12 DIAGNOSIS — B9689 Other specified bacterial agents as the cause of diseases classified elsewhere: Secondary | ICD-10-CM

## 2022-03-12 DIAGNOSIS — Z3046 Encounter for surveillance of implantable subdermal contraceptive: Secondary | ICD-10-CM | POA: Diagnosis not present

## 2022-03-12 DIAGNOSIS — N760 Acute vaginitis: Secondary | ICD-10-CM | POA: Diagnosis not present

## 2022-03-12 NOTE — Progress Notes (Signed)
Pt presents for annual exam. Pt reports having yellow/white vaginal discharge. Denies odor or pain. Denies any abnormal breast changes. Pt would like nexplanon taken out, and does not want replacement BC. Pt plans to use condoms as contraception. No concerns at this time.

## 2022-03-13 NOTE — Progress Notes (Signed)
ANNUAL EXAM Patient name: Sue Cruz MRN [id number]  Date of birth: June 05, 1990 Chief Complaint:   Gynecologic Exam  History of Present Illness:   Sue Cruz is a 32 y.o. [id number] Hispanic female being seen today for a routine annual exam.  Current complaints: None, feeling well. Wants her Nexplanon out as she and her husband desire another pregnancy.  No LMP recorded. (Nexplanon)   Upstream - 03/13/22 1106       Pregnancy Intention Screening   Does the patient want to become pregnant in the next year? Yes    Does the patient's partner want to become pregnant in the next year? Yes    Would the patient like to discuss contraceptive options today? Yes      Contraception Wrap Up   Current Method Hormonal Implant    End Method Pregnant/Seeking Pregnancy;Female Condom    Contraception Counseling Provided Yes    How was the end contraceptive method provided? N/A            The pregnancy intention screening data noted above was reviewed. Potential methods of contraception were discussed. The patient elected to proceed with Pregnant/Seeking Pregnancy; Female Condom.   Last pap 2022. Results were: NILM w/ HRHPV negative. H/O abnormal pap: no Last mammogram: Never (age). Results were: N/A. Family h/o breast cancer: no Last colonoscopy: Never (age). Results were: N/A. Family h/o colorectal cancer: no     03/12/2022   10:20 AM  Depression screen PHQ 2/9  Decreased Interest 0  Down, Depressed, Hopeless 0  PHQ - 2 Score 0  Altered sleeping 0  Tired, decreased energy 0  Change in appetite 0  Feeling bad or failure about yourself  0  Trouble concentrating 0  Suicidal thoughts 0  PHQ-9 Score 0      03/12/2022   10:21 AM  GAD 7 : Generalized Anxiety Score  Nervous, Anxious, on Edge 0  Control/stop worrying 0  Worry too much - different things 0  Trouble relaxing 0  Restless 0  Easily annoyed or irritable 0  Afraid - awful might happen 0  Total GAD 7 Score 0   Review of  Systems:   Pertinent items are noted in HPI Denies any headaches, blurred vision, fatigue, shortness of breath, chest pain, abdominal pain, abnormal vaginal discharge/itching/odor/irritation, problems with periods, bowel movements, urination, or intercourse unless otherwise stated above.  Pertinent History Reviewed:  Reviewed past medical,surgical, social and family history.  Reviewed problem list, medications and allergies.  Physical Assessment:   Vitals:   03/12/22 1014  BP: 104/75  Pulse: 72  Weight: 167 lb 3.2 oz (75.8 kg)  Height: 5' (1.524 m)   Body mass index is 32.65 kg/m.   Physical Examination:  General appearance - well appearing, and in no distress Mental status - alert, oriented to person, place, and time Psych:  She has a normal mood and affect Skin - warm and dry, normal color Chest - effort normal Heart - normal rate and regular rhythm Neck:  midline trachea, no thyromegaly or nodules Breasts - breasts appear normal, no suspicious masses Abdomen - soft, nontender, nondistended, no masses or organomegaly Pelvic - VULVA: normal appearing vulva with no masses, tenderness or lesions  VAGINA: normal appearing vagina with normal color and discharge, no lesions  CERVIX: normal appearing cervix without discharge or lesions, no CMT Thin prep pap is done with HR HPV cotesting Extremities:  No swelling or varicosities noted  Nexplanon Removal Patient was given informed consent  for removal of her Nexplanon.  Appropriate time out taken. Nexplanon site identified.  Area prepped in usual sterile fashon. One ml of 1% lidocaine was used to anesthetize the area at the distal end of the implant. A small stab incision was made right beside the implant on the distal portion.  The Nexplanon rod was grasped using hemostats and removed without difficulty.  There was minimal blood loss. There were no complications.  Steri-strips were applied over the small incision.  A pressure bandage was  applied to reduce any bruising.  The patient tolerated the procedure well and was given post procedure instructions.  Patient is planning to use condoms until she is ready to attempt conception.   Chaperone present for exam and procedure.  No results found for this or any previous visit (from the past 24 hour(s)).  Assessment & Plan:  1. Women's annual routine gynecological examination - Cytology - PAP - Cervicovaginal ancillary only - Will follow up results of pap smear and manage accordingly. - Routine preventative health maintenance measures emphasized. - Mammogram: @ 32yo, or sooner if problems - Colonoscopy: @ 32yo, or sooner if problems  2. Nexplanon removal - Removal tolerated well  No orders of the defined types were placed in this encounter.  Meds: No orders of the defined types were placed in this encounter.  Follow-up: Return in about 1 year (around 03/13/2023) for ANN.  Gabriel Carina, CNM 03/13/2022 11:09 AM

## 2022-03-15 LAB — CERVICOVAGINAL ANCILLARY ONLY
Bacterial Vaginitis (gardnerella): POSITIVE — AB
Candida Glabrata: NEGATIVE
Candida Vaginitis: NEGATIVE
Chlamydia: NEGATIVE
Comment: NEGATIVE
Comment: NEGATIVE
Comment: NEGATIVE
Comment: NEGATIVE
Comment: NEGATIVE
Comment: NORMAL
Neisseria Gonorrhea: NEGATIVE
Trichomonas: NEGATIVE

## 2022-03-19 MED ORDER — FLUCONAZOLE 150 MG PO TABS: 150.0000 mg | ORAL_TABLET | Freq: Every day | ORAL | 1 refills | Status: DC

## 2022-03-19 MED ORDER — METRONIDAZOLE 500 MG PO TABS: 500.0000 mg | ORAL_TABLET | Freq: Two times a day (BID) | ORAL | 0 refills | Status: DC

## 2022-03-19 NOTE — Addendum Note (Signed)
Addended by: Gaylan Gerold on: 03/19/2022 03:46 AM   Modules accepted: Orders

## 2022-03-22 LAB — CYTOLOGY - PAP
Adequacy: ABSENT
Comment: NEGATIVE
Diagnosis: NEGATIVE
High risk HPV: NEGATIVE

## 2022-03-30 ENCOUNTER — Encounter: Payer: Self-pay | Admitting: Obstetrics and Gynecology

## 2022-03-30 ENCOUNTER — Telehealth (INDEPENDENT_AMBULATORY_CARE_PROVIDER_SITE_OTHER): Payer: Medicaid Other | Admitting: Obstetrics and Gynecology

## 2022-03-30 DIAGNOSIS — Z3009 Encounter for other general counseling and advice on contraception: Secondary | ICD-10-CM | POA: Diagnosis not present

## 2022-03-30 DIAGNOSIS — Z309 Encounter for contraceptive management, unspecified: Secondary | ICD-10-CM | POA: Insufficient documentation

## 2022-03-30 DIAGNOSIS — Z30011 Encounter for initial prescription of contraceptive pills: Secondary | ICD-10-CM | POA: Diagnosis not present

## 2022-03-30 MED ORDER — NORETHIN-ETH ESTRAD-FE BIPHAS 1 MG-10 MCG / 10 MCG PO TABS: 1.0000 | ORAL_TABLET | Freq: Every day | ORAL | 11 refills | Status: DC

## 2022-03-30 NOTE — Progress Notes (Signed)
    GYNECOLOGY VIRTUAL VISIT ENCOUNTER NOTE  Provider location: Center for [place] at [facility]   Patient location: Home  I connected with Benard Rink on 03/30/22 at  3:50 PM EST by MyChart Video Encounter and verified that I am speaking with the correct person using two identifiers.   I discussed the limitations, risks, security and privacy concerns of performing an evaluation and management service virtually and the availability of in person appointments. I also discussed with the patient that there may be a patient responsible charge related to this service. The patient expressed understanding and agreed to proceed.   History:  Sue Cruz is a 32 y.o. [phone] female being evaluated today for desire to start OCP's. She denies any abnormal vaginal discharge, bleeding, pelvic pain or other concerns.       Past Medical History:  Diagnosis Date   Cesarean delivery delivered 02/27/2019   Medical history non-contributory    Past Surgical History:  Procedure Laterality Date   CESAREAN SECTION N/A 02/27/2019   Procedure: CESAREAN SECTION;  Surgeon: Truett Mainland, DO;  Location: [facility];  Service: Obstetrics;  Laterality: N/A;   WISDOM TOOTH EXTRACTION     The following portions of the patient's history were reviewed and updated as appropriate: allergies, current medications, past family history, past medical history, past social history, past surgical history and problem list.   Health Maintenance:  Normal pap and negative HRHPV on 2/24.    Review of Systems:  Pertinent items noted in HPI and remainder of comprehensive ROS otherwise negative.  Physical Exam:   General:  Alert, oriented and cooperative. Patient appears to be in no acute distress.  Mental Status: Normal mood and affect. Normal behavior. Normal judgment and thought content.   Respiratory: Normal respiratory effort, no problems with respiration noted  Rest of physical exam deferred due to type of  encounter  Labs and Imaging No results found for this or any previous visit (from the past 336 hour(s)). No results found.     Assessment and Plan:     1. Encounter for initial prescription of contraceptive pills  U/R/B and back up method reviewed with pt Pt verbalized understanding. LMP 1 week ago Last intercourse 3 weeks ago To start OCP's this Sunday  - Norethindrone-Ethinyl Estradiol-Fe Biphas (LO LOESTRIN FE) 1 MG-10 MCG / 10 MCG tablet; Take 1 tablet by mouth daily.  Dispense: 28 tablet; Refill: 11       I discussed the assessment and treatment plan with the patient. The patient was provided an opportunity to ask questions and all were answered. The patient agreed with the plan and demonstrated an understanding of the instructions.   The patient was advised to call back or seek an in-person evaluation/go to the ED if the symptoms worsen or if the condition fails to improve as anticipated.  I provided 8 minutes of face-to-face time during this encounter.   Chancy Milroy, MD Center for [place], [place]

## 2022-03-30 NOTE — Progress Notes (Signed)
Pt presents [facility] consult. Pt interested in low hormonal BC pills.

## 2022-06-24 ENCOUNTER — Encounter: Payer: Self-pay | Admitting: Obstetrics and Gynecology

## 2022-06-24 ENCOUNTER — Ambulatory Visit (INDEPENDENT_AMBULATORY_CARE_PROVIDER_SITE_OTHER): Payer: Medicaid Other | Admitting: Obstetrics and Gynecology

## 2022-06-24 VITALS — BP 97/63 | HR 73 | Ht 60.0 in | Wt 172.0 lb

## 2022-06-24 DIAGNOSIS — Z30017 Encounter for initial prescription of implantable subdermal contraceptive: Secondary | ICD-10-CM | POA: Diagnosis not present

## 2022-06-24 DIAGNOSIS — Z3202 Encounter for pregnancy test, result negative: Secondary | ICD-10-CM | POA: Diagnosis not present

## 2022-06-24 LAB — POCT URINE PREGNANCY: Preg Test, Ur: NEGATIVE

## 2022-06-24 MED ORDER — ETONOGESTREL 68 MG ~~LOC~~ IMPL
68.0000 mg | DRUG_IMPLANT | Freq: Once | SUBCUTANEOUS | Status: AC
  Administered 2022-06-24: 68 mg via SUBCUTANEOUS

## 2022-06-24 NOTE — Progress Notes (Signed)
     GYNECOLOGY CLINIC PROCEDURE NOTE  Sue Cruz is a 32 y.o. [phone] here for Nexplanon insertion.  Last pap smear was on 2/24 and was normal.  No other gynecologic concerns. LMP 06/10/22. Reports last intercourse was prior to LMP  Nexplanon Insertion Procedure Patient identified, informed consent performed, consent signed.   Patient does understand that irregular bleeding is a very common side effect of this medication. She was advised to have backup contraception for one week after placement. Pregnancy test in clinic today was negative.  Appropriate time out taken.  Patient's left arm was prepped and draped in the usual sterile fashion.. The ruler used to measure and mark insertion area.  Patient was prepped with alcohol swab and then injected with 3 ml of 1% lidocaine.  She was prepped with betadine, Nexplanon removed from packaging,  Device confirmed in needle, then inserted full length of needle and withdrawn per handbook instructions. Nexplanon was able to palpated in the patient's arm; patient palpated the insert herself. There was minimal blood loss.  Patient insertion site covered with guaze and a pressure bandage to reduce any bruising.  The patient tolerated the procedure well and was given post procedure instructions.    Nettie Elm, MD, FACOG Attending Obstetrician & Gynecologist Center for [facility], [facility]

## 2022-06-24 NOTE — Progress Notes (Addendum)
32 y.o. GYN presents for Nexplanon Insertion. Pt stopped taking OCP the last week of April/2024.  Last sexual intercourse was 1 week ago.  UPT today is Negative  Administrations This Visit     etonogestrel (NEXPLANON) implant 68 mg     Admin Date 06/24/2022 Action Given Dose 68 mg Route Subdermal Administered By Maretta Bees, RMA

## 2022-06-25 ENCOUNTER — Ambulatory Visit: Payer: Medicaid Other | Admitting: Student

## 2023-01-25 ENCOUNTER — Ambulatory Visit (HOSPITAL_COMMUNITY)
Admission: EM | Admit: 2023-01-25 | Discharge: 2023-01-25 | Disposition: A | Discharge status: Home / Self Care | Payer: Medicaid Other | Attending: Family Medicine | Admitting: Family Medicine

## 2023-01-25 ENCOUNTER — Other Ambulatory Visit: Payer: Self-pay

## 2023-01-25 ENCOUNTER — Encounter (HOSPITAL_COMMUNITY): Payer: Self-pay | Admitting: *Deleted

## 2023-01-25 DIAGNOSIS — K529 Noninfective gastroenteritis and colitis, unspecified: Secondary | ICD-10-CM

## 2023-01-25 MED ORDER — ONDANSETRON 4 MG PO TBDP
ORAL_TABLET | ORAL | Status: AC
  Filled 2023-01-25: qty 1

## 2023-01-25 MED ORDER — ONDANSETRON 4 MG PO TBDP: 4.0000 mg | ORAL_TABLET | Freq: Three times a day (TID) | ORAL | 0 refills | Status: DC | PRN

## 2023-01-25 MED ORDER — ONDANSETRON 4 MG PO TBDP
4.0000 mg | ORAL_TABLET | Freq: Once | ORAL | Status: AC
  Administered 2023-01-25: 4 mg via ORAL

## 2023-01-25 NOTE — Discharge Instructions (Signed)
Ondansetron dissolved in the mouth every 8 hours as needed for nausea or vomiting. Clear liquids(water, gatorade/pedialyte, ginger ale/sprite, chicken broth/soup) and bland things(crackers/toast, rice, potato, bananas) to eat. Avoid acidic foods like lemon/lime/orange/tomato, and avoid greasy/spicy foods.  We have given you 1 dose of this medication here in the office  Tylenol is the best thing to take for pain or fever when you are having stomach trouble.

## 2023-01-25 NOTE — ED Provider Notes (Signed)
[facility]    CSN: [id number] Arrival date & time: 01/25/23  1828      History   Chief Complaint Chief Complaint  Patient presents with   Emesis    HPI Sue Cruz is a 32 y.o. female.    Emesis Here with nausea and vomiting and diarrhea.  Symptoms began last night and she threw up about 4 5 times last evening and has not thrown up at all today.  She is still very nauseated.  She also has had a few episodes of diarrhea and no blood in the diarrhea.  She has had some subjective fever and chills.  No cough or congestion.  Her sister is sick with similar symptoms.  Last menstrual cycle was about 4 months ago but she has the Nexplanon.  NKDA   Past Medical History:  Diagnosis Date   Cesarean delivery delivered 02/27/2019   Medical history non-contributory     Patient Active Problem List   Diagnosis Date Noted   Nexplanon insertion 06/24/2022   Contraception management 03/30/2022    Past Surgical History:  Procedure Laterality Date   CESAREAN SECTION N/A 02/27/2019   Procedure: CESAREAN SECTION;  Surgeon: Levie Heritage, DO;  Location: [facility];  Service: Obstetrics;  Laterality: N/A;   WISDOM TOOTH EXTRACTION      OB History     Gravida  3   Para  3   Term  3   Preterm      AB      Living  3      SAB      IAB      Ectopic      Multiple  0   Live Births  3            Home Medications    Prior to Admission medications   Medication Sig Start Date End Date Taking? Authorizing Provider  ondansetron (ZOFRAN-ODT) 4 MG disintegrating tablet Take 1 tablet (4 mg total) by mouth every 8 (eight) hours as needed for nausea or vomiting. 01/25/23  Yes Anea Fodera, Janace Aris, MD    Family History History reviewed. No pertinent family history.  Social History Social History   Tobacco Use   Smoking status: Never   Smokeless tobacco: Never  Vaping Use   Vaping status: Never Used  Substance Use Topics   Alcohol use: No    Drug use: No     Allergies   Patient has no known allergies.   Review of Systems Review of Systems  Gastrointestinal:  Positive for vomiting.     Physical Exam Triage Vital Signs ED Triage Vitals  Encounter Vitals Group     BP 01/25/23 2003 113/76     Systolic BP Percentile --      Diastolic BP Percentile --      Pulse Rate 01/25/23 2003 84     Resp 01/25/23 2003 18     Temp 01/25/23 2003 99.1 F (37.3 C)     Temp Source 01/25/23 2003 Oral     SpO2 01/25/23 2003 96 %     Weight --      Height --      Head Circumference --      Peak Flow --      Pain Score 01/25/23 2001 4     Pain Loc --      Pain Education --      Exclude from Growth Chart --    No data found.  Updated Vital Signs BP 113/76 (BP Location: Right Arm)   Pulse 84   Temp 99.1 F (37.3 C) (Oral)   Resp 18   SpO2 96%   Visual Acuity Right Eye Distance:   Left Eye Distance:   Bilateral Distance:    Right Eye Near:   Left Eye Near:    Bilateral Near:     Physical Exam Vitals reviewed.  Constitutional:      General: She is not in acute distress.    Appearance: She is not ill-appearing, toxic-appearing or diaphoretic.  HENT:     Mouth/Throat:     Mouth: Mucous membranes are moist.  Eyes:     Extraocular Movements: Extraocular movements intact.     Conjunctiva/sclera: Conjunctivae normal.     Pupils: Pupils are equal, round, and reactive to light.  Cardiovascular:     Rate and Rhythm: Normal rate and regular rhythm.  Pulmonary:     Effort: Pulmonary effort is normal.     Breath sounds: Normal breath sounds.  Abdominal:     Palpations: Abdomen is soft.     Tenderness: There is no abdominal tenderness.  Musculoskeletal:     Cervical back: Neck supple.  Lymphadenopathy:     Cervical: No cervical adenopathy.  Skin:    Coloration: Skin is not jaundiced or pale.  Neurological:     General: No focal deficit present.     Mental Status: She is alert and oriented to person, place, and  time.  Psychiatric:        Behavior: Behavior normal.      UC Treatments / Results  Labs (all labs ordered are listed, but only abnormal results are displayed) Labs Reviewed - No data to display  EKG   Radiology No results found.  Procedures Procedures (including critical care time)  Medications Ordered in UC Medications  ondansetron (ZOFRAN-ODT) disintegrating tablet 4 mg (4 mg Oral Given 01/25/23 2028)    Initial Impression / Assessment and Plan / UC Course  I have reviewed the triage vital signs and the nursing notes.  Pertinent labs & imaging results that were available during my care of the patient were reviewed by me and considered in my medical decision making (see chart for details).     1 dose of Zofran is given here and a prescription of it is also sent to the pharmacy.  We discussed clear liquids and bland food.  Work note is provided Final Clinical Impressions(s) / UC Diagnoses   Final diagnoses:  Gastroenteritis     Discharge Instructions      Ondansetron dissolved in the mouth every 8 hours as needed for nausea or vomiting. Clear liquids(water, gatorade/pedialyte, ginger ale/sprite, chicken broth/soup) and bland things(crackers/toast, rice, potato, bananas) to eat. Avoid acidic foods like lemon/lime/orange/tomato, and avoid greasy/spicy foods.  We have given you 1 dose of this medication here in the office  Tylenol is the best thing to take for pain or fever when you are having stomach trouble.     ED Prescriptions     Medication Sig Dispense Auth. Provider   ondansetron (ZOFRAN-ODT) 4 MG disintegrating tablet Take 1 tablet (4 mg total) by mouth every 8 (eight) hours as needed for nausea or vomiting. 10 tablet Marlinda Mike Janace Aris, MD      PDMP not reviewed this encounter.   Zenia Resides, MD 01/25/23 2034

## 2023-01-25 NOTE — ED Triage Notes (Signed)
Pt reports n/v/d x 1 day. States she had diarrhea last night and vomited x 3. None today. C/o headache, chills,  and subjective fever. She did not go to work today

## 2023-08-04 ENCOUNTER — Other Ambulatory Visit: Payer: Self-pay

## 2023-08-04 ENCOUNTER — Emergency Department (HOSPITAL_COMMUNITY)
Admission: EM | Admit: 2023-08-04 | Discharge: 2023-08-05 | Discharge status: Against Medical Advice | Attending: Emergency Medicine | Admitting: Emergency Medicine

## 2023-08-04 ENCOUNTER — Encounter (HOSPITAL_COMMUNITY): Payer: Self-pay

## 2023-08-04 DIAGNOSIS — Z5321 Procedure and treatment not carried out due to patient leaving prior to being seen by health care provider: Secondary | ICD-10-CM | POA: Insufficient documentation

## 2023-08-04 DIAGNOSIS — Y92410 Unspecified street and highway as the place of occurrence of the external cause: Secondary | ICD-10-CM | POA: Insufficient documentation

## 2023-08-04 DIAGNOSIS — M545 Low back pain, unspecified: Secondary | ICD-10-CM | POA: Insufficient documentation

## 2023-08-04 DIAGNOSIS — R1030 Lower abdominal pain, unspecified: Secondary | ICD-10-CM | POA: Insufficient documentation

## 2023-08-04 LAB — URINALYSIS, ROUTINE W REFLEX MICROSCOPIC
Bilirubin Urine: NEGATIVE
Glucose, UA: NEGATIVE mg/dL
Hgb urine dipstick: NEGATIVE
Ketones, ur: NEGATIVE mg/dL
Leukocytes,Ua: NEGATIVE
Nitrite: NEGATIVE
Protein, ur: NEGATIVE mg/dL
Specific Gravity, Urine: 1.009 (ref 1.005–1.030)
pH: 7 (ref 5.0–8.0)

## 2023-08-04 LAB — CBC
HCT: 41.5 % (ref 36.0–46.0)
Hemoglobin: 13.7 g/dL (ref 12.0–15.0)
MCH: 27.6 pg (ref 26.0–34.0)
MCHC: 33 g/dL (ref 30.0–36.0)
MCV: 83.7 fL (ref 80.0–100.0)
Platelets: 362 10*3/uL (ref 150–400)
RBC: 4.96 MIL/uL (ref 3.87–5.11)
RDW: 12.5 % (ref 11.5–15.5)
WBC: 9.3 10*3/uL (ref 4.0–10.5)
nRBC: 0 % (ref 0.0–0.2)

## 2023-08-04 LAB — COMPREHENSIVE METABOLIC PANEL WITH GFR
ALT: 25 U/L (ref 0–44)
AST: 22 U/L (ref 15–41)
Albumin: 4.1 g/dL (ref 3.5–5.0)
Alkaline Phosphatase: 61 U/L (ref 38–126)
Anion gap: 9 (ref 5–15)
BUN: 12 mg/dL (ref 6–20)
CO2: 24 mmol/L (ref 22–32)
Calcium: 9.2 mg/dL (ref 8.9–10.3)
Chloride: 105 mmol/L (ref 98–111)
Creatinine, Ser: 0.95 mg/dL (ref 0.44–1.00)
GFR, Estimated: >60 mL/min (ref >60)
Glucose, Bld: 89 mg/dL (ref 70–99)
Potassium: 3.9 mmol/L (ref 3.5–5.1)
Sodium: 138 mmol/L (ref 135–145)
Total Bilirubin: 0.6 mg/dL (ref 0.0–1.2)
Total Protein: 7.1 g/dL (ref 6.5–8.1)

## 2023-08-04 LAB — LIPASE, BLOOD: Lipase: 31 U/L (ref 11–51)

## 2023-08-04 LAB — HCG, SERUM, QUALITATIVE: Preg, Serum: NEGATIVE
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
## 2023-08-05 ENCOUNTER — Ambulatory Visit (HOSPITAL_COMMUNITY)
Admission: EM | Admit: 2023-08-05 | Discharge: 2023-08-05 | Disposition: A | Discharge status: Home / Self Care | Attending: Emergency Medicine | Admitting: Emergency Medicine

## 2023-08-05 ENCOUNTER — Encounter (HOSPITAL_COMMUNITY): Payer: Self-pay

## 2023-08-05 ENCOUNTER — Ambulatory Visit (HOSPITAL_COMMUNITY): Payer: Self-pay

## 2023-08-05 DIAGNOSIS — R1030 Lower abdominal pain, unspecified: Secondary | ICD-10-CM | POA: Diagnosis not present

## 2023-08-05 DIAGNOSIS — M545 Low back pain, unspecified: Secondary | ICD-10-CM | POA: Diagnosis not present

## 2023-08-05 MED ORDER — IBUPROFEN 800 MG PO TABS: 800.0000 mg | ORAL_TABLET | Freq: Three times a day (TID) | ORAL | 0 refills | Status: AC

## 2023-08-05 MED ORDER — CYCLOBENZAPRINE HCL 10 MG PO TABS: 10.0000 mg | ORAL_TABLET | Freq: Two times a day (BID) | ORAL | 0 refills | Status: AC | PRN
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
## 2023-08-05 NOTE — ED Notes (Signed)
 Patient said she did not want to stay. I tried to convince her to stay but she said no. OTF.

## 2023-08-05 NOTE — Discharge Instructions (Signed)
 You can take the muscle relaxer FLEXERIL  twice daily. If the medication makes you drowsy, take only at bed time.  Ibuprofen  can be alternated with tylenol  every 4 hours as needed  I recommend ice pack or hot pad to the belly and back  Monitor symptoms. Gradual improvement is expected with these interventions. If for some reason pain persists or becomes worse/severe, please return to the emergency department.
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
## 2023-09-12 ENCOUNTER — Encounter (HOSPITAL_COMMUNITY): Payer: Self-pay

## 2023-09-12 ENCOUNTER — Ambulatory Visit (HOSPITAL_COMMUNITY)
Admission: EM | Admit: 2023-09-12 | Discharge: 2023-09-12 | Disposition: A | Discharge status: Home / Self Care | Attending: Physician Assistant | Admitting: Physician Assistant

## 2023-09-12 DIAGNOSIS — L02411 Cutaneous abscess of right axilla: Secondary | ICD-10-CM | POA: Diagnosis not present

## 2023-09-12 MED ORDER — SULFAMETHOXAZOLE-TRIMETHOPRIM 800-160 MG PO TABS
1.0000 | ORAL_TABLET | Freq: Two times a day (BID) | ORAL | 0 refills | Status: AC
Start: 2023-09-12 — End: 2023-09-17

## 2023-09-12 NOTE — Discharge Instructions (Signed)
 Recommend warm compress and gentle massage. Take antibiotic as prescribed. If no improvement please return for recheck in about 1 week.

## 2023-09-12 NOTE — ED Triage Notes (Signed)
 Patient presents to the office for right arm pit lump x 4 days. Patient denies any fever or drainage.

## 2023-09-12 NOTE — ED Provider Notes (Signed)
 [facility]    CSN: [id number] Arrival date & time: 09/12/23  1827      History   Chief Complaint No chief complaint on file.   HPI Sue Cruz is a 33 y.o. female.   Patient presents with a small lump to the right armpit that started about 4 days ago.  She reports tenderness to the area.  Denies drainage, fever, chills.  She has tried nothing for the symptoms.  She has never experienced similar symptoms in the past.    Past Medical History:  Diagnosis Date   Cesarean delivery delivered 02/27/2019   Medical history non-contributory     Patient Active Problem List   Diagnosis Date Noted   Nexplanon  insertion 06/24/2022   Contraception management 03/30/2022    Past Surgical History:  Procedure Laterality Date   CESAREAN SECTION N/A 02/27/2019   Procedure: CESAREAN SECTION;  Surgeon: Barbra Lang PARAS, DO;  Location: [facility];  Service: Obstetrics;  Laterality: N/A;   WISDOM TOOTH EXTRACTION      OB History     Gravida  3   Para  3   Term  3   Preterm      AB      Living  3      SAB      IAB      Ectopic      Multiple  0   Live Births  3            Home Medications    Prior to Admission medications   Medication Sig Start Date End Date Taking? Authorizing Provider  sulfamethoxazole -trimethoprim  (BACTRIM  DS) 800-160 MG tablet Take 1 tablet by mouth 2 (two) times daily for 5 days. 09/12/23 09/17/23 Yes Ward, Harlene PEDLAR, PA-C  cyclobenzaprine  (FLEXERIL ) 10 MG tablet Take 1 tablet (10 mg total) by mouth 2 (two) times daily as needed for muscle spasms. 08/05/23   Rising, Asberry, PA-C  ibuprofen  (ADVIL ) 800 MG tablet Take 1 tablet (800 mg total) by mouth 3 (three) times daily. 08/05/23   Rising, Asberry RIGGERS    Family History History reviewed. No pertinent family history.  Social History Social History   Tobacco Use   Smoking status: Never   Smokeless tobacco: Never  Vaping Use   Vaping status: Never Used  Substance Use Topics    Alcohol use: No   Drug use: No     Allergies   Patient has no known allergies.   Review of Systems Review of Systems  Constitutional:  Negative for chills and fever.  HENT:  Negative for ear pain and sore throat.   Eyes:  Negative for pain and visual disturbance.  Respiratory:  Negative for cough and shortness of breath.   Cardiovascular:  Negative for chest pain and palpitations.  Gastrointestinal:  Negative for abdominal pain and vomiting.  Genitourinary:  Negative for dysuria and hematuria.  Musculoskeletal:  Negative for arthralgias and back pain.  Skin:  Negative for color change and rash.  Neurological:  Negative for seizures and syncope.  All other systems reviewed and are negative.    Physical Exam Triage Vital Signs ED Triage Vitals [09/12/23 1907]  Encounter Vitals Group     BP 118/75     Girls Systolic BP Percentile      Girls Diastolic BP Percentile      Boys Systolic BP Percentile      Boys Diastolic BP Percentile      Pulse Rate 70  Resp 18     Temp 98.2 F (36.8 C)     Temp Source Oral     SpO2 98 %     Weight      Height      Head Circumference      Peak Flow      Pain Score      Pain Loc      Pain Education      Exclude from Growth Chart    No data found.  Updated Vital Signs BP 118/75 (BP Location: Left Arm)   Pulse 70   Temp 98.2 F (36.8 C) (Oral)   Resp 18   SpO2 98%   Visual Acuity Right Eye Distance:   Left Eye Distance:   Bilateral Distance:    Right Eye Near:   Left Eye Near:    Bilateral Near:     Physical Exam Vitals and nursing note reviewed.  Constitutional:      General: She is not in acute distress.    Appearance: She is well-developed.  HENT:     Head: Normocephalic and atraumatic.  Eyes:     Conjunctiva/sclera: Conjunctivae normal.  Cardiovascular:     Rate and Rhythm: Normal rate and regular rhythm.     Heart sounds: No murmur heard. Pulmonary:     Effort: Pulmonary effort is normal. No  respiratory distress.     Breath sounds: Normal breath sounds.  Abdominal:     Palpations: Abdomen is soft.     Tenderness: There is no abdominal tenderness.  Musculoskeletal:        General: No swelling.     Cervical back: Neck supple.  Skin:    General: Skin is warm and dry.     Capillary Refill: Capillary refill takes less than 2 seconds.     Comments: Small nodule to right axilla, tenderness to palpation, no fluctuance noted.  Neurological:     Mental Status: She is alert.  Psychiatric:        Mood and Affect: Mood normal.      UC Treatments / Results  Labs (all labs ordered are listed, but only abnormal results are displayed) Labs Reviewed - No data to display  EKG   Radiology No results found.  Procedures Procedures (including critical care time)  Medications Ordered in UC Medications - No data to display  Initial Impression / Assessment and Plan / UC Course  I have reviewed the triage vital signs and the nursing notes.  Pertinent labs & imaging results that were available during my care of the patient were reviewed by me and considered in my medical decision making (see chart for details).     At this time we will treat as a small abscess, no fluctuance noticed today.  No indication for I&D.  Could possibly be a small lymph node as well.  Will start on antibiotics and supportive care discussed.  If no improvement or it does not go away advised patient to return for recheck.  She does not have a PCP currently. Final Clinical Impressions(s) / UC Diagnoses   Final diagnoses:  Abscess of right axilla     Discharge Instructions      Recommend warm compress and gentle massage. Take antibiotic as prescribed. If no improvement please return for recheck in about 1 week.   ED Prescriptions     Medication Sig Dispense Auth. Provider   sulfamethoxazole -trimethoprim  (BACTRIM  DS) 800-160 MG tablet Take 1 tablet by mouth 2 (two) times daily  for 5 days. 10 tablet  Ward, Gaetan Spieker Z, PA-C      PDMP not reviewed this encounter.   Ward, Harlene PEDLAR, PA-C 09/12/23 [phone]

## 2023-10-03 ENCOUNTER — Ambulatory Visit: Admitting: Physician Assistant

## 2024-01-18 ENCOUNTER — Ambulatory Visit: Admitting: Physician Assistant

## 2024-02-14 ENCOUNTER — Ambulatory Visit (INDEPENDENT_AMBULATORY_CARE_PROVIDER_SITE_OTHER): Admitting: Advanced Practice Midwife

## 2024-02-14 ENCOUNTER — Encounter: Payer: Self-pay | Admitting: Advanced Practice Midwife

## 2024-02-14 VITALS — BP 121/76 | HR 78 | Ht <= 58 in | Wt 158.2 lb

## 2024-02-14 DIAGNOSIS — Z3046 Encounter for surveillance of implantable subdermal contraceptive: Secondary | ICD-10-CM

## 2024-02-14 NOTE — Progress Notes (Signed)
 GYNECOLOGY CLINIC PROCEDURE NOTE  Sue Cruz is a 34 y.o. [phone] here for Nexplanon  removal.  Last pap smear was on 03/12/2022 and was normal.  No other gynecologic concerns.  Nexplanon  Removal Patient identified, informed consent performed, consent signed.   Appropriate time out taken. Nexplanon  site identified.  Area prepped in usual sterile fashon. One ml of 1% lidocaine  was used to anesthetize the area at the distal end of the implant. A small stab incision was made right beside the implant on the distal portion.  The Nexplanon  rod was grasped using hemostats and removed without difficulty.  There was minimal blood loss. There were no complications.  3 ml of 1% lidocaine  was injected around the incision for post-procedure analgesia.  Steri-strips were applied over the small incision.  A pressure bandage was applied to reduce any bruising.  The patient tolerated the procedure well and was given post procedure instructions.  Patient is planning to use natural family planning.   No follow-ups on file.   Olam Boards, CNM 2:34 PM

## 2024-02-14 NOTE — Progress Notes (Signed)
 Pt presents for Nexplanon  removal  Nexplanon  placed 1.5 years ago  Pt c/o no periods in 1 year  Does not want new BC, no plans to conceive
